# Patient Record
Sex: Female | Born: 1950
Health system: Southern US, Community
[De-identification: ages and names within clinical notes are randomized; demographics above are authoritative.]

## PROBLEM LIST (undated history)

## (undated) HISTORY — PX: BREAST EXCISIONAL BIOPSY: SUR124

---

## 2002-10-13 ENCOUNTER — Encounter: Admission: RE | Admit: 2002-10-13 | Discharge: 2002-10-13 | Payer: Self-pay | Admitting: Gastroenterology

## 2002-10-13 ENCOUNTER — Encounter: Payer: Self-pay | Admitting: Gastroenterology

## 2002-10-20 ENCOUNTER — Ambulatory Visit (HOSPITAL_COMMUNITY): Admission: RE | Admit: 2002-10-20 | Discharge: 2002-10-20 | Payer: Self-pay | Admitting: Gastroenterology

## 2012-03-26 DIAGNOSIS — E559 Vitamin D deficiency, unspecified: Secondary | ICD-10-CM | POA: Insufficient documentation

## 2012-11-04 ENCOUNTER — Other Ambulatory Visit: Payer: Self-pay

## 2012-11-04 DIAGNOSIS — Z1231 Encounter for screening mammogram for malignant neoplasm of breast: Secondary | ICD-10-CM

## 2012-11-17 ENCOUNTER — Ambulatory Visit: Payer: Self-pay

## 2012-11-18 ENCOUNTER — Ambulatory Visit
Admission: RE | Admit: 2012-11-18 | Discharge: 2012-11-18 | Disposition: A | Discharge status: Home / Self Care | Payer: 59 | Source: Ambulatory Visit

## 2012-11-18 DIAGNOSIS — Z1231 Encounter for screening mammogram for malignant neoplasm of breast: Secondary | ICD-10-CM

## 2012-11-23 ENCOUNTER — Other Ambulatory Visit: Payer: Self-pay | Admitting: Family Medicine

## 2012-11-23 DIAGNOSIS — N6320 Unspecified lump in the left breast, unspecified quadrant: Secondary | ICD-10-CM

## 2012-11-23 DIAGNOSIS — R928 Other abnormal and inconclusive findings on diagnostic imaging of breast: Secondary | ICD-10-CM

## 2012-12-13 ENCOUNTER — Ambulatory Visit
Admission: RE | Admit: 2012-12-13 | Discharge: 2012-12-13 | Disposition: A | Discharge status: Home / Self Care | Payer: 59 | Source: Ambulatory Visit | Attending: Family Medicine | Admitting: Family Medicine

## 2012-12-13 DIAGNOSIS — R928 Other abnormal and inconclusive findings on diagnostic imaging of breast: Secondary | ICD-10-CM

## 2013-03-06 DIAGNOSIS — K222 Esophageal obstruction: Secondary | ICD-10-CM | POA: Insufficient documentation

## 2013-11-28 ENCOUNTER — Other Ambulatory Visit: Payer: Self-pay

## 2013-11-28 DIAGNOSIS — Z1231 Encounter for screening mammogram for malignant neoplasm of breast: Secondary | ICD-10-CM

## 2013-11-29 ENCOUNTER — Ambulatory Visit
Admission: RE | Admit: 2013-11-29 | Discharge: 2013-11-29 | Disposition: A | Discharge status: Home / Self Care | Payer: 59 | Source: Ambulatory Visit

## 2013-11-29 DIAGNOSIS — Z1231 Encounter for screening mammogram for malignant neoplasm of breast: Secondary | ICD-10-CM

## 2016-07-04 ENCOUNTER — Ambulatory Visit (INDEPENDENT_AMBULATORY_CARE_PROVIDER_SITE_OTHER): Payer: Medicare HMO | Admitting: Family Medicine

## 2016-07-04 VITALS — BP 150/80 | HR 53 | Temp 98.0°F | Resp 20 | Ht <= 58 in | Wt 107.1 lb

## 2016-07-04 DIAGNOSIS — I10 Essential (primary) hypertension: Secondary | ICD-10-CM

## 2016-07-04 DIAGNOSIS — M159 Polyosteoarthritis, unspecified: Secondary | ICD-10-CM

## 2016-07-04 DIAGNOSIS — E559 Vitamin D deficiency, unspecified: Secondary | ICD-10-CM | POA: Diagnosis not present

## 2016-07-04 DIAGNOSIS — N951 Menopausal and female climacteric states: Secondary | ICD-10-CM | POA: Diagnosis not present

## 2016-07-04 DIAGNOSIS — J45909 Unspecified asthma, uncomplicated: Secondary | ICD-10-CM | POA: Insufficient documentation

## 2016-07-04 DIAGNOSIS — M150 Primary generalized (osteo)arthritis: Secondary | ICD-10-CM

## 2016-07-04 DIAGNOSIS — Z131 Encounter for screening for diabetes mellitus: Secondary | ICD-10-CM

## 2016-07-04 DIAGNOSIS — E789 Disorder of lipoprotein metabolism, unspecified: Secondary | ICD-10-CM

## 2016-07-04 DIAGNOSIS — R634 Abnormal weight loss: Secondary | ICD-10-CM | POA: Diagnosis not present

## 2016-07-04 LAB — POCT URINALYSIS DIP (MANUAL ENTRY)
BILIRUBIN UA: NEGATIVE
Blood, UA: NEGATIVE
Glucose, UA: NEGATIVE mg/dL
Ketones, POC UA: NEGATIVE mg/dL
LEUKOCYTES UA: NEGATIVE
Nitrite, UA: NEGATIVE
Protein Ur, POC: NEGATIVE mg/dL
Spec Grav, UA: 1.015 (ref 1.010–1.025)
Urobilinogen, UA: 0.2 E.U./dL
pH, UA: 6 (ref 5.0–8.0)

## 2016-07-04 NOTE — Patient Instructions (Addendum)
We recommend that you schedule a mammogram for breast cancer screening. Typically, you do not need a referral to do this. Please contact a local imaging center to schedule your mammogram.  [facility] - [phone]  *ask for the Radiology Department [facility]) - [phone] or [phone]  [facility] - [phone] [facility] - [phone] [facility] - [phone]  *ask for the Radiology Department [facility] - [phone]  *ask for the Radiology Department [facility] - [phone]  *ask for the Mammography Department [facility] - [phone]   IF you received an x-ray today, you will receive an invoice from [facility] Radiology. Please contact [facility] Radiology at [phone] with questions or concerns regarding your invoice.   IF you received labwork today, you will receive an invoice from [facility]. Please contact [facility] at [phone] with questions or concerns regarding your invoice.   Our billing staff will not be able to assist you with questions regarding bills from these companies.  You will be contacted with the lab results as soon as they are available. The fastest way to get your results is to activate your My Chart account. Instructions are located on the last page of this paperwork. If you have not heard from [place] regarding the results in 2 weeks, please contact this office.      Qu?n l t?ng huy?t p Managing Your Hypertension T?ng huy?t p th??ng ???c g?i l huy?t p cao. T?ng huy?t p l khi l?c c?a mu p ln thnh ??ng m?ch qu l?n. Cc ??ng m?ch l cc m?ch mu mang mu t? tim ?i kh?p c? th? c?a qu v?. T?ng huy?t p khi?n tim lm vi?c v?t v? h?n ?? b?m mu, v c th? khi?n cc ??ng m?ch tr? ln h?p ho?c c?ng. T?ng huy?t p khng ???c ?i?u tr? ho?c ki?m sot c th? d?n t?i nh?i mu c? tim, ??t qu?, b?nh th?n v nh?ng v?n ?? khc. C nh?ng  ch? s? huy?t p no? Ch? s? ?o huy?t p g?m m?t ch? s? cao trn m?t ch? s? th?p. Huy?t a?p ly? t???ng cu?a quy? vi? la? d??i 120/80. Ch? s? ??u tin ("??nh") ???c g?i l huy?t p tm thu. ?y l s? ?o p su?t trong ??ng m?ch khi tim qu v? ??p. Ch? s? th? hai ("?y") ???c g?i l huy?t p tm tr??ng. ?y l s? ?o p su?t trong ??ng m?ch khi tim qu v? ngh?Nicki Reaper? s? huy?t p c?a ti c ngh?a l g? Huy?t p ???c phn lo?i thnh b?n giai ?o?n. D?a trn ch? s? huy?t p c?a qu v?, chuyn gia ch?m  s?c kh?e c?a qu v? c th? s? d?ng nh?ng giai ?o?n sau ?y ?? xc ??nh ki?u ?i?u tr? qu v? c?n, n?u c. Huy?t p tm thu v huy?t p tm tr??ng ???c ?o theo ??n v? mm Hg. Bnh th??ng   Huy?t p tm thu: d??i 120.  Huy?t p tm tr??ng: d??i 80. Cao   Huy?t p tm thu: 120-129.  Huy?t p tm tr??ng: d??i 80. T?ng huy?t p giai ?o?n 1     Huy?t p tm tr??ng: 80-89. T?ng huy?t p giai ?o?n 2   Huy?t p tm thu: 140 tr? ln.  Huy?t p tm tr??ng: 90 tr? ln. Nh?ng nguy c? s?c kh?e no g?n v?i t?ng huy?t p? Qu?n l t?ng huy?t p c?a qu v? l m?t trch nhi?m quan tr?ng. T?ng  huy?t p khng ???c ki?m sot c th? d?n t?i:  Nh?i mu c? tim.  B? ??t qu?Marland Kitchen  M?ch mu b? y?u (ch?ng phnh m?ch).  Suy tim.  T?n th??ng th?n.  T?n th??ng m?t.  H?i ch?ng chuy?n ha.  Cc v?n ?? v? t?p trung v tr nh?. Ti c th? th?c hi?n nh?ng thay ??i no ?? qu?n l t?ng huy?t p c?a mnh? C th? qu?n l t?ng huy?t p b?ng cch thay ??i l?i s?ng v c th? l dng thu?c. Chuyn gia ch?m [place] s?c kh?e c?a qu v? s? gip qu v? ln k? ho?ch ??a huy?t p v? gi?i h?n bnh th??ng. ?n v u?ng   ?n ch? ?? giu ch?t x? v kali v t mu?i (natri), ???ng ph? gia v ch?t bo. M?t k? ho?ch ?n m?u c tn ch? ?? ?n DASH (Cch ti?p c?n ?n u?ng ?? gi?m t?ng huy?t p). ?n theo cch ny:  ?n nhi?u tri cy v rau t??i. Vo m?i b?a ?n, c? g?ng dnh m?t n?a ??a cho tri cy v rau.  ?n ng? c?c nguyn h?t, ch?ng h?n nh? m ?ng  lm t? b?t m nguyn cm, ho?c bnh m nguyn h?t. Cho ngu? c?c nguyn ca?m va?o m?t ph?n t? ??a c?a quy? vi?.  ?n cc s?n ph?m t? s?a t bo.  Trnh nh?ng mi?ng th?t nhi?u m?, th?t ? qua ch? bi?n ho?c th?t ??p mu?i v th?t gia c?m c da. Dnh kho?ng m?t ph?n t? ??a c?a qu v? cho cc protein khng m?, ch?ng h?n nh? c, th?t g khng da, ??u, tr?ng, v ??u ph?.  Trnh nh?ng th?c ph?m ch? bi?n ho?c lm s?n. Nh?ng th?c ph?m ny th??ng c nhi?u natri, ???ng ph? gia v ch?t bo h?n.    Gi?i h?n l??ng r??u qu v? u?ng khng qu 1 ly m?i ngy v?i ph? n? khng mang thai v 2 ly m?i ngy v?i nam gi?i. M?t ly t??ng ???ng v?i 12 ao-x? bia, 5 ao-x? r??u vang, ho?c 1 ao-x? r??u m?nh. L?i s?ng   H?p tc v?i chuyn gia ch?m [place] s?c kh?e c?a qu v? ?? duy tr tr?ng l??ng c? th? c l?i cho s?c kh?e, ho?c gi?m cn. Hy h?i xem tr?ng l??ng no l l t??ng cho qu v?.  Dnh t nh?t 30 pht ?? t?p th? d?c m c th? khi?n tim qu v? ??p nhanh h?n (t?p th? d?c nh?p ?i?u) h?u h?t cc ngy trong tu?n. Cc ho?t ??ng c th? bao g?m ?i b?, b?i, ho?c ??p xe.      Ki?m sot b?t k? tnh tr?ng ko di (m?n tnh) no m qu v? c, ch?ng h?n nh? cholesterol cao ho?c ti?u ???ng. Theo di   Theo di huy?t p c?a qu v? t?i nh theo h??ng d?n c?a chuyn gia ch?m [place] s?c kh?e. Huy?t p m?c tiu c nhn c?a qu v? c th? khc nhau ty thu?c v tnh tr?ng b?nh l, tu?i v cc nhn t? khc.  Ki?m tra huy?t p ??nh k?, th??ng xuyn theo h??ng d?n c?a chuyn gia ch?m [place] s?c kh?e. Trao ??i v?i chuyn gia ch?m [place] s?c kh?e c?a mnh   Cng v?i chuyn gia ch?m [place] s?c kh?e xem xt l?i m?i lo?i thu?c qu v? dng b?i v c th? c tc d?ng ph? ho?c t??ng tc thu?c.  Trao ??i v?i chuyn gia ch?m  s?c kh?e c?a qu v? v? ch? ?? ?n, thi qun luy?n t?p v cc y?u t?  l?i s?ng khc m c th? gp ph?n vo t?ng huy?t p.  Khm chuyn gia ch?m [place] s?c kh?e ??nh k?. Chuyn gia ch?m [place] s?c kh?e c?a qu v? c th? gip qu v? t?o ra v  ?i?u ch?nh k? ho?ch qu?n l t?ng huy?t p. Ti c c?n dng thu?c ?? ki?m sot huy?t p c?a mnh khng? N?u thay ??i l?i s?ng khng ?? ?? ??a huy?t p v? m?c c th? ki?m sot ???c, chuyn gia ch?m [place] s?c kh?e c th? k ??n thu?c, v n?u:  Huy?t p tm thu c?a qu v? t? 130 tr? ln.  Huy?t p tm tr??ng c?a qu v? t? 80 tr? ln. Ch? s? d?ng thu?c theo ch? d?n c?a chuyn gia ch?m [place] s?c kh?e cu?a quy? vi?. Lm theo ch? d?n m?t cch c?n th?n. Thu?c ?i?u tr? huy?t p ph?i ???c dng theo ??n ? k. Thu?c c?ng s? khng c tc d?ng khi qu v? b? li?u. Vi?c b? li?u thu?c c?ng lm qu v? c nguy c? pht sinh v?n ??. Hy lin l?c v?i chuyn gia ch?m [place] s?c kh?e n?u:  Qu v? ngh? qu v? c ph?n ?ng v?i thu?c ? dng.  Qu v? b? ?au ??u l?p ?i l?p l?i (tr? l?i).  Qu v? c?m th?y chng m?t.  Qu v? b? s?ng ph ? m?t c chn.  Qu v? c v?n ?? v? th? l?c. Yu c?u tr? gip ngay l?p t?c n?u:  Qu v? b? ?au ??u n?ng ho?c l l?n.  Qu v? b? y?u b?t th??ng ho?c t b, ho?c c?m th?y b? ng?t.  Qu v? b? ?au r?t nhi?u ? ng?c ho?c b?ng.  Qu v? nn nhi?u l?n.  Qu v? b? kh th?. Tm t?t  T?ng huy?t p l khi l?c b?m mu qua ??ng m?ch c?a qu v? qu m?nh. N?u tnh tr?ng ny khng ???c ki?m sot, n c th? khi?n qu v? g?p ph?i nguy c? bi?n ch?ng nghim tr?ng.  Huy?t p m?c tiu c nhn c?a qu v? c th? khc nhau ty thu?c v tnh tr?ng b?nh l, tu?i v cc nhn t? khc. ??i v?i h?u h?t m?i ng??i, huy?t p bnh th??ng l d??i 120/80.  Qu?n l t?ng huy?t p b?ng cch thay ??i l?i s?ng, dng thu?c, ho?c k?t h?p c? hai. Thay ??i l?i s?ng bao g?m gi?m cn, ?n ch? ?? ?n c l?i cho s?c kh?e, t mu?i, t?p th? d?c nhi?u h?n v h?n ch? u?ng r??u. Thng tin ny khng nh?m m?c ?ch thay th? cho l?i khuyn m chuyn gia ch?m [place] s?c kh?e ni v?i qu v?. Hy b?o ??m qu v? ph?i th?o lu?n b?t k? v?n ?? g m qu v? c v?i chuyn gia ch?m [place] s?c kh?e c?a qu v?. Document Released: 02/19/2016 Document Revised:  02/19/2016 Document Reviewed: 02/19/2016 Elsevier Interactive Patient Education  2017 [employer].

## 2016-07-04 NOTE — Progress Notes (Signed)
Chief Complaint  Patient presents with  . Anemia    HPI   ID [id number]  Essential Hypertension Pt reports that she has not been diagnosed with hypertension.  She checks her bp daily and gets sbp 120-125 at home. She eats a healthy well balanced diet.  Arthritis She works in an Theatre stage manager She reports pain in her thumbs and fingers and wanted to know what she can take for it  Screening She would like to be screened for diabetes and high cholesterol   No past medical history on file.  No current outpatient prescriptions on file.   No current facility-administered medications for this visit.     Allergies: No Known Allergies  No past surgical history on file.  Social History   Social History  . Marital status: Married    Spouse name: N/A  . Number of children: N/A  . Years of education: N/A   Social History Main Topics  . Smoking status: Never Smoker  . Smokeless tobacco: Never Used  . Alcohol use No  . Drug use: No  . Sexual activity: Not on file   Other Topics Concern  . Not on file   Social History Narrative  . No narrative on file    Review of Systems  Constitutional: Negative for chills, fever, malaise/fatigue and weight loss.  Eyes: Negative for blurred vision, double vision and photophobia.  Respiratory: Negative for cough, shortness of breath and wheezing.   Cardiovascular: Negative for chest pain and palpitations.  Gastrointestinal: Negative for abdominal pain, diarrhea, nausea and vomiting.  Genitourinary: Negative for dysuria, frequency and urgency.  Skin: Negative for itching and rash.  Neurological: Negative for dizziness, tingling and headaches.  Psychiatric/Behavioral: Negative for depression and hallucinations. The patient is not nervous/anxious.     Objective: Vitals:   07/04/16 1519 07/04/16 1558  BP: (!) 165/81 (!) 150/80  Pulse: (!) 53   Resp: 20   Temp: 98 F (36.7 C)   TempSrc: Oral   SpO2: 97%   Weight: 107 lb 2 oz (48.6  kg)   Height:  (1.448 m)    BP 152/97 01/09/15 Weight 107 # 01/09/15  Physical Exam  Constitutional: She is oriented to person, place, and time. She appears well-developed and well-nourished.  HENT:  Head: Normocephalic and atraumatic.  Right Ear: External ear normal.  Left Ear: External ear normal.  Mouth/Throat: Oropharynx is clear and moist.  Eyes: Conjunctivae and EOM are normal.  Cardiovascular: Normal rate, regular rhythm and normal heart sounds.   Pulmonary/Chest: Effort normal and breath sounds normal. No respiratory distress. She has no wheezes.  Neurological: She is alert and oriented to person, place, and time. She displays normal reflexes.  Skin: Skin is warm. Capillary refill takes less than 2 seconds. No erythema.    Hand exam Swans neck changes of 2nd and 3rd digits bilaterally   Review of Care Everywhere  01/09/2015 LDL 121 TC 217 HDL 76 HGB 04.5 HCT 38.4    Assessment and Plan Deborah Glass was seen today for anemia.  Diagnoses and all orders for this visit:  Borderline high cholesterol- reviewed care everywhere and levels were borderline but good -     Comprehensive metabolic panel -     Lipid panel  Screening for diabetes mellitus- will screen with fasting glucose a1c 01/09/15 was 5.4  Menopausal state- review of careeverywhere shows history of anemia  Will check today Will also check vit d since this is necessary for bone health -  CBC with Differential/Platelet -     VITAMIN D 25 Hydroxy (Vit-D Deficiency, Fractures) -     POCT urinalysis dipstick  Essential hypertension- diet controlled and aggravated by white coat hypertension Continue home bp monitoring Dash diet -     Microalbumin, urine  Vitamin D deficiency- will check   -     VITAMIN D 25 Hydroxy (Vit-D Deficiency, Fractures)  Primary osteoarthritis involving multiple joints- alleve arthritis for pain prn  Other orders -     Cancel: CBC with Differential/Platelet     Jaqualyn Juday  A Talulah Schirmer

## 2016-07-05 LAB — MICROALBUMIN, URINE

## 2016-07-06 LAB — CBC WITH DIFFERENTIAL/PLATELET
Basophils Absolute: 0 10*3/uL (ref 0.0–0.2)
Basos: 0 %
EOS (ABSOLUTE): 0.4 10*3/uL (ref 0.0–0.4)
EOS: 6 %
HEMOGLOBIN: 12.7 g/dL (ref 11.1–15.9)
Hematocrit: 38.1 % (ref 34.0–46.6)
Immature Grans (Abs): 0 10*3/uL (ref 0.0–0.1)
Immature Granulocytes: 0 %
LYMPHS ABS: 2.2 10*3/uL (ref 0.7–3.1)
Lymphs: 35 %
MCH: 30.1 pg (ref 26.6–33.0)
MCHC: 33.3 g/dL (ref 31.5–35.7)
MCV: 90 fL (ref 79–97)
MONOS ABS: 0.3 10*3/uL (ref 0.1–0.9)
Monocytes: 5 %
Neutrophils Absolute: 3.5 10*3/uL (ref 1.4–7.0)
Neutrophils: 54 %
Platelets: 255 10*3/uL (ref 150–379)
RBC: 4.22 x10E6/uL (ref 3.77–5.28)
RDW: 13.6 % (ref 12.3–15.4)
WBC: 6.5 10*3/uL (ref 3.4–10.8)

## 2016-07-06 LAB — COMPREHENSIVE METABOLIC PANEL
A/G RATIO: 1.6 (ref 1.2–2.2)
ALK PHOS: 73 IU/L (ref 39–117)
ALT: 18 IU/L (ref 0–32)
AST: 26 IU/L (ref 0–40)
Albumin: 4.3 g/dL (ref 3.6–4.8)
BUN/Creatinine Ratio: 24 (ref 12–28)
BUN: 14 mg/dL (ref 8–27)
Bilirubin Total: 0.3 mg/dL (ref 0.0–1.2)
CO2: 26 mmol/L (ref 18–29)
Calcium: 9.1 mg/dL (ref 8.7–10.3)
Chloride: 103 mmol/L (ref 96–106)
Creatinine, Ser: 0.59 mg/dL (ref 0.57–1.00)
GFR calc Af Amer: 110 mL/min/{1.73_m2} (ref >59)
GFR calc non Af Amer: 95 mL/min/{1.73_m2} (ref >59)
Globulin, Total: 2.7 g/dL (ref 1.5–4.5)
Glucose: 86 mg/dL (ref 65–99)
POTASSIUM: 4 mmol/L (ref 3.5–5.2)
Sodium: 141 mmol/L (ref 134–144)
Total Protein: 7 g/dL (ref 6.0–8.5)

## 2016-07-06 LAB — LIPID PANEL
Chol/HDL Ratio: 2.4 ratio (ref 0.0–4.4)
Cholesterol, Total: 209 mg/dL — ABNORMAL HIGH (ref 100–199)
HDL: 87 mg/dL (ref >39)
LDL Calculated: 108 mg/dL — ABNORMAL HIGH (ref 0–99)
TRIGLYCERIDES: 71 mg/dL (ref 0–149)
VLDL CHOLESTEROL CAL: 14 mg/dL (ref 5–40)

## 2016-07-06 LAB — VITAMIN D 25 HYDROXY (VIT D DEFICIENCY, FRACTURES): Vit D, 25-Hydroxy: 23.6 ng/mL — ABNORMAL LOW (ref 30.0–100.0)

## 2016-08-19 ENCOUNTER — Encounter: Payer: Self-pay | Admitting: Family Medicine

## 2017-01-23 DIAGNOSIS — K0889 Other specified disorders of teeth and supporting structures: Secondary | ICD-10-CM | POA: Diagnosis not present

## 2017-01-23 DIAGNOSIS — G4700 Insomnia, unspecified: Secondary | ICD-10-CM | POA: Diagnosis not present

## 2017-01-23 DIAGNOSIS — Z972 Presence of dental prosthetic device (complete) (partial): Secondary | ICD-10-CM | POA: Diagnosis not present

## 2017-01-23 DIAGNOSIS — Z6822 Body mass index (BMI) 22.0-22.9, adult: Secondary | ICD-10-CM | POA: Diagnosis not present

## 2017-01-23 DIAGNOSIS — J302 Other seasonal allergic rhinitis: Secondary | ICD-10-CM | POA: Diagnosis not present

## 2017-01-23 DIAGNOSIS — K08409 Partial loss of teeth, unspecified cause, unspecified class: Secondary | ICD-10-CM | POA: Diagnosis not present

## 2017-01-23 DIAGNOSIS — Z0000 Encounter for general adult medical examination without abnormal findings: Secondary | ICD-10-CM | POA: Diagnosis not present

## 2017-06-14 ENCOUNTER — Encounter: Payer: Self-pay | Admitting: Family Medicine

## 2017-06-14 ENCOUNTER — Ambulatory Visit (INDEPENDENT_AMBULATORY_CARE_PROVIDER_SITE_OTHER): Payer: Medicare HMO | Admitting: Family Medicine

## 2017-06-14 ENCOUNTER — Other Ambulatory Visit: Payer: Self-pay

## 2017-06-14 VITALS — BP 130/60 | HR 69 | Temp 98.3°F | Ht <= 58 in | Wt 104.0 lb

## 2017-06-14 DIAGNOSIS — R040 Epistaxis: Secondary | ICD-10-CM | POA: Diagnosis not present

## 2017-06-14 NOTE — Patient Instructions (Addendum)
IF you received an x-ray today, you will receive an invoice from [facility] Radiology. Please contact [facility] Radiology at [phone] with questions or concerns regarding your invoice.   IF you received labwork today, you will receive an invoice from [facility]. Please contact [facility] at [phone] with questions or concerns regarding your invoice.   Our billing staff will not be able to assist you with questions regarding bills from these companies.  You will be contacted with the lab results as soon as they are available. The fastest way to get your results is to activate your My Chart account. Instructions are located on the last page of this paperwork. If you have not heard from [place] regarding the results in 2 weeks, please contact this office.     Ch?y mu cam, Ng??i l?n (Nosebleed, Adult) Ch?y mu cam l khi mu ch?y ra t? m?i. Ch?y mu cam l tnh tr?ng ph? bi?n. Th??ng th ch?y mu cam khng ph?i l d?u hi?u c?a m?t tnh tr?ng nghim tr?ng. Ch?y mu cam c th? x?y ra n?u m?ch mu nh? trong m?i b?t ??u ch?y mu ho?c n?u nim m?c m?i (mng nh?y) n?t ra. Cc nguyn nhn ph? bi?n c?a ch?y mu cam:  D? ?ng.  C?m l?nh.  Ngoy m?i.  X m?i qu m?nh.  Ch?n th??ng do v?t g ? ?m vo trong m?i hay b? ?nh vo m?i.  Khng kh kh ho?c l?nh. Cc nguyn nhn t ph? bi?n h?n c?a ch?y mu cam bao g?m:  Khi ??c.  C ci g ? b?t th??ng trong m?i ho?c trong cc khoang kh gi?a cc x??ng m?t (xoang).  Kh?i u trong m?i, ch?ng h?n nh? polip.  Cc thu?c hay tnh tr?ng khi?n mu ?ng ch?m.  M?t s? b?nh ho?c th? thu?t gy kch ?ng hay lm kh ???ng m?i. H??NG D?N CH?M [place] T?I NH Khi qu v? b? ch?y mu cam:  Ng?i xu?ng v h?i ng? ??u v? pha tr??c.  Dng kh?n ho?c gi?y l?a s?ch k?p ch?t hai l? m?i bn d??i ph?n c x??ng c?a m?i. Sau 10 pht, nh? tay ra kh?i m?i v xem mu c b?t ??u ch?y l?i khng. Khng nh? tay p m?i ra tr??c th?i gian ?Marland Kitchen N?u v?n cn ch?y mu, l?p l?i  qu trnh k?p hai l? m?i nh? trn v gi? trong vng 10 pht cho ??n khi mu ng?ng ch?y.  Khng nht gi?y l?a hay g?c vo trong m?i ?? ng?n ch?y mu.  Trnh n?m xu?ng v trnh ng? ??u v? pha sau. ? t? th? ?, mu c th? ch?y xu?ng h?ng v gy ?e ho?c ho.  S? d?ng thu?c lm thng m?i d?ng x?t ?? h? tr? tnh tr?ng ch?y mu cam theo s? ch? d?n c?a chuyn gia ch?m [place] s?c kh?e.  Khng bi m? khong hay d?u khong vo trong m?i. Ch?t ny c th? nh? gi?t xu?ng ph?i. Sau khi h?t ch?y mu cam:  Trnh x m?i ho?c kh?t m?i m?nh trong vng vi gi?Marland Kitchen  Trnh ko/??y m?nh, nh?c, hay ci g?p ? vng th?t l?ng trong vi ngy. Qu v? c th? tr? l?i cc ho?t ??ng bnh th??ng khc khi c th?.  S? d?ng n??c mu?i d?ng x?t ho?c my ?i?u ?m theo s? ch? d?n c?a chuyn gia ch?m [place] s?c kh?e.  Thu?c aspirin v thu?c ch?ng ?ng mu lm ch?y mu d? x?y ra h?n. N?u qu v? ???c k nh?ng lo?i thu?c ny v qu v? b? ch?y mu  cam: ? Hy h?i chuyn gia ch?m [place] s?c kh?e xem qu v? c ph?i ng?ng dng nh?ng thu?c ny ho?c c ph?i ?i?u ch?nh li?u dng khng. ? Khng ng?ng dng nh?ng lo?i thu?c m chuyn gia ch?m [place] s?c kh?e khuyn dng tr? khi chuyn gia ch?m [place] s?c kh?e ch? d?n nh? v?y.  N?u ch?y mu cam do nim m?c kh, hy s? d?ng n??c mu?i khng c?n k ??n d?ng x?t m?i ho?c d?ng gel. Vi?c ny gip lm ?m nim m?c v gip lnh l?i. N?u qu v? ph?i s? d?ng ch?t bi tr?n: ? Hy ch?n lo?i c th? tan trong n??c. ? Ch? s? d?ng v?i l??ng c?n thi?t v ch? s? d?ng m?i khi c?n thi?t. ? Khng n?m xu?ng cho ??n khi ? s? d?ng ???c vi gi?. ?I KHM N?U:  Qu v? b? s?t.  Qu v? b? ch?y mu cam th??ng xuyn ho?c th??ng xuyn h?n so v?i bnh th??ng.  Qu v? r?t d? dng b? b?m tm.  Qu v? b? ch?y mu cam do c v?t g ? ?m vo m?i.  Qu v? b? ch?y mu trong mi?ng.  Qu v? nn ho?c ho ra ch?t c m?u nu.  Qu v? b? ch?y mu cam sau khi b?t ??u dng m?t lo?i thu?c m?i. NGAY L?P T?C ?I KHM N?U:  Qu v? b? ch?y mu cam sau  khi b? ng ho?c ch?n th??ng ? ??u.  Tnh tr?ng ch?y mu cam c?a qu v? khng h?t sau 20 pht.  Qu v? c?m th?y chng m?t ho?c y?u.  Qu v? b? ch?y mu b?t th??ng t? nh?ng b? ph?n khc c?a c? th?.  Qu v? b? b?m tm b?t th??ng trn nh?ng b? ph?n khc c?a c? th?.  Qu v? v m? hi ??m ?a.  Qu v? nn ra mu. Thng tin ny khng nh?m m?c ?ch thay th? cho l?i khuyn m chuyn gia ch?m [place] s?c kh?e ni v?i qu v?. Hy b?o ??m qu v? ph?i th?o lu?n b?t k? v?n ?? g m qu v? c v?i chuyn gia ch?m [place] s?c kh?e c?a qu v?. Document Released: 12/17/2004 Document Revised: 03/30/2014 Document Reviewed: 09/24/2015 Elsevier Interactive Patient Education  2018 [employer]

## 2017-06-14 NOTE — Progress Notes (Signed)
   3/25/20196:25 PM  Deborah Glass [birth year], 67 y.o. female [id number]  Chief Complaint  Patient presents with  . Hemoptysis    HPI:   Patient is a 67 y.o. female who presents today for 3-4 days of spitting up scant blood in the mornings when she brushes her teeth. She denies any other times of spitting up blood. She denies any swollen painful gums or teeth, she denies any h/o abnormal bleeding. She does report recently having itchy nose so she has been scratching with a q tip. She has had some mild sore throat. She denies any swollen glands, night sweats, cough, weight loss. Last dental appt 6 months ago.  [facility] 06/14/2017 07/04/2016  Decreased Interest 0 0  Down, Depressed, Hopeless 0 0  PHQ - 2 Score 0 0    No Known Allergies  Prior to Admission medications   Not on File    History reviewed. No pertinent past medical history.  History reviewed. No pertinent surgical history.  Social History   Tobacco Use  . Smoking status: Never Smoker  . Smokeless tobacco: Never Used  Substance Use Topics  . Alcohol use: No    Family History  Problem Relation Age of Onset  . Stomach cancer Father     ROS Per hpi  OBJECTIVE:  Blood pressure 130/60, pulse 69, temperature 98.3 F (36.8 C), height 4' 9.09" (1.45 m), weight 104 lb (47.2 kg), SpO2 97 %.  Physical Exam  Constitutional: She is well-developed, well-nourished, and in no distress.  HENT:  Head: Normocephalic and atraumatic.  Right Ear: External ear normal.  Left Ear: External ear normal.  Nose: No mucosal edema or rhinorrhea. Epistaxis (clots present, no active bleeding) is observed.  Mouth/Throat: Mucous membranes are normal. No oral lesions. No dental abscesses. No oropharyngeal exudate or posterior oropharyngeal erythema.     ASSESSMENT and PLAN  1. Epistaxis Discussed use of humidifier, nasal saline washes, no nose picking. RTC precautions reviewed.   Return if symptoms worsen or  fail to improve.    Myles LippsIrma M Santiago, MD Primary Care at [facility] [address] [place], [place] [address] Ph.  [phone] Fax [phone]

## 2018-07-14 ENCOUNTER — Other Ambulatory Visit: Payer: Self-pay | Admitting: Family Medicine

## 2018-07-14 DIAGNOSIS — Z1231 Encounter for screening mammogram for malignant neoplasm of breast: Secondary | ICD-10-CM

## 2018-09-09 ENCOUNTER — Ambulatory Visit: Payer: Self-pay

## 2018-10-20 ENCOUNTER — Other Ambulatory Visit: Payer: Self-pay

## 2018-10-20 ENCOUNTER — Ambulatory Visit
Admission: RE | Admit: 2018-10-20 | Discharge: 2018-10-20 | Disposition: A | Discharge status: Home / Self Care | Payer: Medicare HMO | Source: Ambulatory Visit | Attending: Family Medicine | Admitting: Family Medicine

## 2018-10-20 DIAGNOSIS — Z1231 Encounter for screening mammogram for malignant neoplasm of breast: Secondary | ICD-10-CM

## 2019-01-24 LAB — HM COLONOSCOPY

## 2019-04-05 ENCOUNTER — Telehealth: Payer: Self-pay | Admitting: *Deleted

## 2019-04-05 NOTE — Telephone Encounter (Signed)
Schedule AWV.  

## 2019-10-23 ENCOUNTER — Encounter (HOSPITAL_COMMUNITY): Payer: Self-pay

## 2019-10-23 ENCOUNTER — Ambulatory Visit (HOSPITAL_COMMUNITY)
Admission: EM | Admit: 2019-10-23 | Discharge: 2019-10-23 | Disposition: A | Discharge status: Home / Self Care | Payer: Medicare HMO | Attending: Urgent Care | Admitting: Urgent Care

## 2019-10-23 ENCOUNTER — Other Ambulatory Visit: Payer: Self-pay

## 2019-10-23 DIAGNOSIS — R69 Illness, unspecified: Secondary | ICD-10-CM | POA: Diagnosis not present

## 2019-10-23 DIAGNOSIS — R0789 Other chest pain: Secondary | ICD-10-CM | POA: Diagnosis not present

## 2019-10-23 DIAGNOSIS — J4530 Mild persistent asthma, uncomplicated: Secondary | ICD-10-CM | POA: Insufficient documentation

## 2019-10-23 DIAGNOSIS — R0602 Shortness of breath: Secondary | ICD-10-CM | POA: Diagnosis not present

## 2019-10-23 DIAGNOSIS — Z20822 Contact with and (suspected) exposure to covid-19: Secondary | ICD-10-CM | POA: Insufficient documentation

## 2019-10-23 DIAGNOSIS — J069 Acute upper respiratory infection, unspecified: Secondary | ICD-10-CM | POA: Diagnosis not present

## 2019-10-23 MED ORDER — PREDNISONE 10 MG PO TABS: 30.0000 mg | ORAL_TABLET | Freq: Every day | ORAL | 0 refills | Status: DC

## 2019-10-23 MED ORDER — NEBULIZER/TUBING/MOUTHPIECE KIT: PACK | 1 refills | Status: DC

## 2019-10-23 MED ORDER — ALBUTEROL SULFATE (2.5 MG/3ML) 0.083% IN NEBU: 2.5000 mg | INHALATION_SOLUTION | Freq: Four times a day (QID) | RESPIRATORY_TRACT | 0 refills | Status: DC | PRN

## 2019-10-23 MED ORDER — BENZONATATE 100 MG PO CAPS: 100.0000 mg | ORAL_CAPSULE | Freq: Three times a day (TID) | ORAL | 0 refills | Status: DC | PRN

## 2019-10-23 MED ORDER — PROMETHAZINE-DM 6.25-15 MG/5ML PO SYRP: 5.0000 mL | ORAL_SOLUTION | Freq: Every evening | ORAL | 0 refills | Status: DC | PRN

## 2019-10-23 MED ORDER — ALBUTEROL SULFATE HFA 108 (90 BASE) MCG/ACT IN AERS: 1.0000 | INHALATION_SPRAY | Freq: Four times a day (QID) | RESPIRATORY_TRACT | 0 refills | Status: DC | PRN

## 2019-10-23 MED ORDER — NEBULIZER SYSTEM ALL-IN-ONE MISC: 0 refills | Status: DC

## 2019-10-23 NOTE — ED Provider Notes (Signed)
[facility]   MRN: [id number] DOB: 11-Sep-1950  Subjective:   Sherine VIKKI GAINS is a 69 y.o. female presenting for 4-day history of acute onset productive cough, congestion.  Patient has a history of asthma and states that she has felt shortness of breath, chest pain with her coughing.  She is out of her albuterol inhaler, would also like a prescription for her nebulized albuterol and nebulizer machine.  States that she has felt subjective fever.  Has had her Covid vaccination, no history of COVID-19.  Denies loss of sense of taste and smell.  No current facility-administered medications for this encounter. No current outpatient medications on file.   No Known Allergies  History reviewed. No pertinent past medical history.   Past Surgical History:  Procedure Laterality Date  . BREAST EXCISIONAL BIOPSY Right     Family History  Problem Relation Age of Onset  . Stomach cancer Father     Social History   Tobacco Use  . Smoking status: Never Smoker  . Smokeless tobacco: Never Used  Substance Use Topics  . Alcohol use: No  . Drug use: No    ROS   Objective:   Vitals: BP 134/82 (BP Location: Left Arm)   Pulse 68   Temp 99 F (37.2 C) (Oral)   Resp 17   SpO2 95%   Physical Exam Constitutional:      General: She is not in acute distress.    Appearance: Normal appearance. She is well-developed. She is not ill-appearing, toxic-appearing or diaphoretic.  HENT:     Head: Normocephalic and atraumatic.     Right Ear: Tympanic membrane, ear canal and external ear normal. No drainage or tenderness. No middle ear effusion. Tympanic membrane is not erythematous.     Left Ear: Tympanic membrane, ear canal and external ear normal. No drainage or tenderness.  No middle ear effusion. Tympanic membrane is not erythematous.     Nose: Nose normal. No congestion or rhinorrhea.     Mouth/Throat:     Mouth: Mucous membranes are moist. No oral lesions.     Pharynx: Oropharynx is  clear. No pharyngeal swelling, oropharyngeal exudate, posterior oropharyngeal erythema or uvula swelling.     Tonsils: No tonsillar exudate or tonsillar abscesses.  Eyes:     General: No scleral icterus.       Right eye: No discharge.        Left eye: No discharge.     Extraocular Movements: Extraocular movements intact.     Right eye: Normal extraocular motion.     Left eye: Normal extraocular motion.     Conjunctiva/sclera: Conjunctivae normal.     Pupils: Pupils are equal, round, and reactive to light.  Cardiovascular:     Rate and Rhythm: Normal rate and regular rhythm.     Pulses: Normal pulses.     Heart sounds: Normal heart sounds. No murmur heard.  No friction rub. No gallop.   Pulmonary:     Effort: Pulmonary effort is normal. No respiratory distress.     Breath sounds: Normal breath sounds. No stridor. No wheezing, rhonchi or rales.  Musculoskeletal:     Cervical back: Normal range of motion and neck supple.  Lymphadenopathy:     Cervical: No cervical adenopathy.  Skin:    General: Skin is warm and dry.     Findings: No rash.  Neurological:     General: No focal deficit present.     Mental Status: She is alert and oriented  to person, place, and time.  Psychiatric:        Mood and Affect: Mood normal.        Behavior: Behavior normal.        Thought Content: Thought content normal.        Judgment: Judgment normal.      Assessment and Plan :   PDMP not reviewed this encounter.  1. Viral URI with cough   2. Shortness of breath   3. Atypical chest pain   4. Mild persistent asthma without complication     In light of patient's asthma, will refill her albuterol inhaler, provided with prescription for nebulizer machine, nebulizer butyryl and will also use a 5-day steroid course at 30 mg once daily.  Will manage for viral illness such as viral URI, viral syndrome, viral rhinitis, COVID-19. Counseled patient on nature of COVID-19 including modes of transmission,  diagnostic testing, management and supportive care.  Offered scripts for symptomatic relief. COVID 19 testing is pending. Counseled patient on potential for adverse effects with medications prescribed/recommended today, ER and return-to-clinic precautions discussed, patient verbalized understanding.     Wallis Bamberg, [place] 10/23/19 1806

## 2019-10-23 NOTE — ED Triage Notes (Signed)
Pt presents with non productive cough and congestion X 4 days. 

## 2019-10-24 LAB — SARS CORONAVIRUS 2 (TAT 6-24 HRS): SARS Coronavirus 2: NEGATIVE

## 2019-11-01 ENCOUNTER — Ambulatory Visit (INDEPENDENT_AMBULATORY_CARE_PROVIDER_SITE_OTHER): Payer: Medicare HMO

## 2019-11-01 ENCOUNTER — Ambulatory Visit (HOSPITAL_COMMUNITY)
Admission: EM | Admit: 2019-11-01 | Discharge: 2019-11-01 | Disposition: A | Discharge status: Home / Self Care | Payer: Medicare HMO | Attending: Urgent Care | Admitting: Urgent Care

## 2019-11-01 ENCOUNTER — Encounter (HOSPITAL_COMMUNITY): Payer: Self-pay | Admitting: Emergency Medicine

## 2019-11-01 ENCOUNTER — Other Ambulatory Visit: Payer: Self-pay

## 2019-11-01 DIAGNOSIS — J4530 Mild persistent asthma, uncomplicated: Secondary | ICD-10-CM

## 2019-11-01 DIAGNOSIS — J019 Acute sinusitis, unspecified: Secondary | ICD-10-CM

## 2019-11-01 DIAGNOSIS — R0602 Shortness of breath: Secondary | ICD-10-CM

## 2019-11-01 DIAGNOSIS — R059 Cough, unspecified: Secondary | ICD-10-CM

## 2019-11-01 DIAGNOSIS — R05 Cough: Secondary | ICD-10-CM | POA: Diagnosis not present

## 2019-11-01 DIAGNOSIS — R509 Fever, unspecified: Secondary | ICD-10-CM | POA: Diagnosis not present

## 2019-11-01 MED ORDER — AMOXICILLIN 500 MG PO CAPS: 500.0000 mg | ORAL_CAPSULE | Freq: Three times a day (TID) | ORAL | 0 refills | Status: DC

## 2019-11-01 MED ORDER — CETIRIZINE HCL 10 MG PO TABS: 10.0000 mg | ORAL_TABLET | Freq: Every day | ORAL | 0 refills | Status: DC

## 2019-11-01 NOTE — ED Provider Notes (Addendum)
[place]   MRN: [id number] DOB: 1950-07-30  Subjective:   Deborah Glass is a 69 y.o. female presenting for 10-day history of persistent cough, shortness of breath, malaise, frontal headache that wraps around to the left side of her head.  Patient has a history of asthma, was seen in 10/23/2019, prescribed prednisone course and given prescription for nebulizer machine with albuterol.  She has used these consistently.  Had a negative COVID-19 test.  Denies confusion, vision change, weakness, runny or stuffy nose, ear pain, throat pain.  No current facility-administered medications for this encounter.  Current Outpatient Medications:  .  albuterol (PROVENTIL) (2.5 MG/3ML) 0.083% nebulizer solution, Take 3 mLs (2.5 mg total) by nebulization every 6 (six) hours as needed for wheezing or shortness of breath., Disp: 75 mL, Rfl: 0 .  albuterol (VENTOLIN HFA) 108 (90 Base) MCG/ACT inhaler, Inhale 1-2 puffs into the lungs every 6 (six) hours as needed for wheezing or shortness of breath., Disp: 18 g, Rfl: 0 .  benzonatate (TESSALON) 100 MG capsule, Take 1-2 capsules (100-200 mg total) by mouth 3 (three) times daily as needed., Disp: 60 capsule, Rfl: 0 .  Nebulizer System All-In-One MISC, Use once every 4-6 hours as needed with albuterol., Disp: 1 each, Rfl: 0 .  predniSONE (DELTASONE) 10 MG tablet, Take 3 tablets (30 mg total) by mouth daily with breakfast., Disp: 15 tablet, Rfl: 0 .  promethazine-dextromethorphan (PROMETHAZINE-DM) 6.25-15 MG/5ML syrup, Take 5 mLs by mouth at bedtime as needed for cough., Disp: 100 mL, Rfl: 0 .  Respiratory Therapy Supplies (NEBULIZER/TUBING/MOUTHPIECE) KIT, Use once every 4-6 hours as needed with albuterol., Disp: 1 kit, Rfl: 1   No Known Allergies  History reviewed. No pertinent past medical history.   Past Surgical History:  Procedure Laterality Date  . BREAST EXCISIONAL BIOPSY Right     Family History  Problem Relation Age of Onset  . Stomach  cancer Father     Social History   Tobacco Use  . Smoking status: Never Smoker  . Smokeless tobacco: Never Used  Substance Use Topics  . Alcohol use: No  . Drug use: No    ROS   Objective:   Vitals: BP (!) 160/87 (BP Location: Right Arm)   Pulse 88   Temp 99.6 F (37.6 C) (Oral)   Resp 18   SpO2 98%   Wt Readings from Last 3 Encounters:  06/14/17 104 lb (47.2 kg)  07/04/16 107 lb 2 oz (48.6 kg)   Temp Readings from Last 3 Encounters:  11/01/19 99.6 F (37.6 C) (Oral)  10/23/19 99 F (37.2 C) (Oral)  06/14/17 98.3 F (36.8 C)   BP Readings from Last 3 Encounters:  11/01/19 (!) 160/87  10/23/19 134/82  06/14/17 130/60   Pulse Readings from Last 3 Encounters:  11/01/19 88  10/23/19 68  06/14/17 69   BP recheck 160/80.   Physical Exam Constitutional:      General: She is not in acute distress.    Appearance: Normal appearance. She is well-developed. She is not ill-appearing, toxic-appearing or diaphoretic.  HENT:     Head: Normocephalic and atraumatic.     Right Ear: External ear normal.     Left Ear: External ear normal.     Nose: Nose normal.     Mouth/Throat:     Mouth: Mucous membranes are moist.     Pharynx: Oropharynx is clear. No oropharyngeal exudate or posterior oropharyngeal erythema.  Eyes:     General: No scleral  icterus.       Right eye: No discharge.        Left eye: No discharge.     Extraocular Movements: Extraocular movements intact.     Conjunctiva/sclera: Conjunctivae normal.     Pupils: Pupils are equal, round, and reactive to light.  Cardiovascular:     Rate and Rhythm: Normal rate and regular rhythm.     Pulses: Normal pulses.     Heart sounds: Normal heart sounds. No murmur heard.  No friction rub. No gallop.   Pulmonary:     Effort: Pulmonary effort is normal. No respiratory distress.     Breath sounds: Normal breath sounds. No stridor. No wheezing, rhonchi or rales.  Musculoskeletal:     Right lower leg: No edema.      Left lower leg: No edema.  Skin:    General: Skin is warm and dry.     Findings: No rash.  Neurological:     Mental Status: She is alert and oriented to person, place, and time.     Cranial Nerves: No cranial nerve deficit.     Motor: No weakness.     Coordination: Coordination normal.     Gait: Gait normal.     Deep Tendon Reflexes: Reflexes normal.     Comments: Negative Romberg and pronator drift.  Psychiatric:        Mood and Affect: Mood normal.        Behavior: Behavior normal.        Thought Content: Thought content normal.        Judgment: Judgment normal.    Negative x-ray, overread is pending.  Assessment and Plan :   PDMP not reviewed this encounter.  1. Acute non-recurrent sinusitis, unspecified location   2. Cough   3. Shortness of breath   4. Mild persistent asthma without complication     Start amoxicillin to cover for sinus infection.  Overread is pending, patient was primarily concerned with ruling out cancer.  I tried to reassure her on this.  Ultimately if the radiology report shows something concerning I will call the patient.  Maintain albuterol treatments as needed. Counseled patient on potential for adverse effects with medications prescribed/recommended today, ER and return-to-clinic precautions discussed, patient verbalized understanding.    Jaynee Eagles, [place] 11/01/19 1730   DG Chest 2 View  Result Date: 11/01/2019 CLINICAL DATA:  Cough, shortness of breath, wheezing, and fever. EXAM: CHEST - 2 VIEW COMPARISON:  None. FINDINGS: The heart size and mediastinal contours are within normal limits. Both lungs are clear. The visualized skeletal structures are unremarkable. IMPRESSION: No active cardiopulmonary disease. Electronically Signed   By: Titus Dubin M.D.   On: 11/01/2019 17:30     Jaynee Eagles, PA-C 11/01/19 1733

## 2019-11-01 NOTE — ED Triage Notes (Addendum)
Triage done using a vietnamese interpreter.  Pt presents to [facility] for assessment of continued cough, fever, body aches, and shortness of breath since her last visit.  COVID was negative at last visit

## 2020-01-10 ENCOUNTER — Other Ambulatory Visit: Payer: Self-pay

## 2020-01-10 ENCOUNTER — Ambulatory Visit (INDEPENDENT_AMBULATORY_CARE_PROVIDER_SITE_OTHER): Payer: Medicare HMO | Admitting: Family Medicine

## 2020-01-10 ENCOUNTER — Encounter: Payer: Self-pay | Admitting: Family Medicine

## 2020-01-10 VITALS — BP 117/71 | HR 71 | Temp 98.1°F | Ht <= 58 in | Wt 106.0 lb

## 2020-01-10 DIAGNOSIS — R1012 Left upper quadrant pain: Secondary | ICD-10-CM | POA: Diagnosis not present

## 2020-01-10 DIAGNOSIS — E559 Vitamin D deficiency, unspecified: Secondary | ICD-10-CM | POA: Diagnosis not present

## 2020-01-10 DIAGNOSIS — Z1329 Encounter for screening for other suspected endocrine disorder: Secondary | ICD-10-CM | POA: Diagnosis not present

## 2020-01-10 DIAGNOSIS — K0889 Other specified disorders of teeth and supporting structures: Secondary | ICD-10-CM

## 2020-01-10 DIAGNOSIS — Z13220 Encounter for screening for lipoid disorders: Secondary | ICD-10-CM

## 2020-01-10 DIAGNOSIS — Z780 Asymptomatic menopausal state: Secondary | ICD-10-CM | POA: Diagnosis not present

## 2020-01-10 DIAGNOSIS — Z23 Encounter for immunization: Secondary | ICD-10-CM | POA: Diagnosis not present

## 2020-01-10 DIAGNOSIS — Z1159 Encounter for screening for other viral diseases: Secondary | ICD-10-CM

## 2020-01-10 DIAGNOSIS — Z131 Encounter for screening for diabetes mellitus: Secondary | ICD-10-CM

## 2020-01-10 DIAGNOSIS — Z13820 Encounter for screening for osteoporosis: Secondary | ICD-10-CM

## 2020-01-10 DIAGNOSIS — E2839 Other primary ovarian failure: Secondary | ICD-10-CM

## 2020-01-10 DIAGNOSIS — Z972 Presence of dental prosthetic device (complete) (partial): Secondary | ICD-10-CM

## 2020-01-10 NOTE — Addendum Note (Signed)
Addended by: Alm Bustard R on: 01/10/2020 03:16 PM   Modules accepted: Orders

## 2020-01-10 NOTE — Progress Notes (Signed)
10/20/20213:11 PM  Deborah Glass 24-Aug-1950, 69 y.o., female [id number]  Chief Complaint  Patient presents with  . screening for elevated glucose  . screening for cholesterol  . Abdominal Pain    intermittent     HPI:   Patient is a 69 y.o. female with no past medical history significant for who presents today for abdominal pain  Abdominal pain Intermittent LUQ pain lasts for 30 min and goes away Been occurring over years No specific time of day occurs about twice a month Not associated with eating At its worst 2/10 Unable to describe Denies N/V, constipation, blood in stool, heartburn, poor taste in mouth Has tried no medications for this  Recent ER visits 11/01/2019: sinusitis 10/23/2019: Viral URI  Depression screen [facility] 2/9 01/10/2020 06/14/2017 07/04/2016  Decreased Interest 0 0 0  Down, Depressed, Hopeless 0 0 0  PHQ - 2 Score 0 0 0    Fall Risk  01/10/2020 06/14/2017 07/04/2016  Falls in the past year? 0 No No  Number falls in past yr: 0 - -  Injury with Fall? 0 - -  Follow up Falls evaluation completed - -     No Known Allergies  Prior to Admission medications   Medication Sig Start Date End Date Taking? Authorizing Provider  albuterol (PROVENTIL) (2.5 MG/3ML) 0.083% nebulizer solution Take 3 mLs (2.5 mg total) by nebulization every 6 (six) hours as needed for wheezing or shortness of breath. Patient not taking: Reported on 01/10/2020 10/23/19   Jaynee Eagles, PA-C  albuterol (VENTOLIN HFA) 108 (90 Base) MCG/ACT inhaler Inhale 1-2 puffs into the lungs every 6 (six) hours as needed for wheezing or shortness of breath. Patient not taking: Reported on 01/10/2020 10/23/19   Jaynee Eagles, PA-C  amoxicillin (AMOXIL) 500 MG capsule Take 1 capsule (500 mg total) by mouth 3 (three) times daily. Patient not taking: Reported on 01/10/2020 11/01/19   Jaynee Eagles, PA-C  benzonatate (TESSALON) 100 MG capsule Take 1-2 capsules (100-200 mg total) by mouth 3 (three) times daily as  needed. Patient not taking: Reported on 01/10/2020 10/23/19   Jaynee Eagles, PA-C  cetirizine (ZYRTEC ALLERGY) 10 MG tablet Take 1 tablet (10 mg total) by mouth daily. Patient not taking: Reported on 01/10/2020 11/01/19   Jaynee Eagles, PA-C  Nebulizer System All-In-One MISC Use once every 4-6 hours as needed with albuterol. Patient not taking: Reported on 01/10/2020 10/23/19   Jaynee Eagles, PA-C  promethazine-dextromethorphan (PROMETHAZINE-DM) 6.25-15 MG/5ML syrup Take 5 mLs by mouth at bedtime as needed for cough. Patient not taking: Reported on 01/10/2020 10/23/19   Jaynee Eagles, PA-C  Respiratory Therapy Supplies (NEBULIZER/TUBING/MOUTHPIECE) KIT Use once every 4-6 hours as needed with albuterol. Patient not taking: Reported on 01/10/2020 10/23/19   Jaynee Eagles, PA-C    No past medical history on file.  Past Surgical History:  Procedure Laterality Date  . BREAST EXCISIONAL BIOPSY Right     Social History   Tobacco Use  . Smoking status: Never Smoker  . Smokeless tobacco: Never Used  Substance Use Topics  . Alcohol use: No    Family History  Problem Relation Age of Onset  . Stomach cancer Father     Review of Systems  Constitutional: Negative for chills, fever, malaise/fatigue and weight loss.  Eyes: Negative for blurred vision and double vision.  Respiratory: Negative for cough, shortness of breath and wheezing.   Cardiovascular: Negative for chest pain, palpitations and leg swelling.  Gastrointestinal: Negative for blood in stool, constipation, diarrhea,  heartburn, nausea and vomiting.       Intermittent abdominal pain lasting 63mn twice per month   Genitourinary: Negative for dysuria, frequency and hematuria.  Musculoskeletal: Negative for back pain and joint pain.  Neurological: Negative for dizziness, weakness and headaches.  Endo/Heme/Allergies: Does not bruise/bleed easily.     OBJECTIVE:  Today's Vitals   01/10/20 1411  BP: 117/71  Pulse: 71  Temp: 98.1 F (36.7 C)   SpO2: 95%  Weight: 106 lb (48.1 kg)  Height: 4' 9"  (1.448 m)   Body mass index is 22.94 kg/m.  Screenings Mammogram: last 10/20/2018 Needs mammogram: 09/2018 last  dexa scan: Scheduled Colon cancer screen: Colonoscopy 01/2019: no abnormalities: 10 years follow up Needs pneumonia, tetanus and flu vaccine  Physical Exam Constitutional:      General: She is not in acute distress.    Appearance: Normal appearance. She is not ill-appearing.  HENT:     Head: Normocephalic.     Mouth/Throat:     Mouth: Mucous membranes are moist.     Pharynx: Oropharynx is clear. No oropharyngeal exudate or posterior oropharyngeal erythema.  Cardiovascular:     Rate and Rhythm: Normal rate and regular rhythm.     Pulses: Normal pulses.     Heart sounds: Normal heart sounds. No murmur heard.  No friction rub. No gallop.   Pulmonary:     Effort: Pulmonary effort is normal. No respiratory distress.     Breath sounds: Normal breath sounds. No stridor. No wheezing, rhonchi or rales.  Abdominal:     General: Abdomen is flat. Bowel sounds are normal. There is no distension.     Palpations: Abdomen is soft. There is no mass.     Tenderness: There is no abdominal tenderness. There is no guarding.     Hernia: No hernia is present.  Musculoskeletal:     Right lower leg: No edema.     Left lower leg: No edema.  Skin:    General: Skin is warm and dry.     Coloration: Skin is not jaundiced.     Findings: No bruising.  Neurological:     Mental Status: She is alert and oriented to person, place, and time.  Psychiatric:        Mood and Affect: Mood normal.        Behavior: Behavior normal.    No results found for this or any previous visit (from the past 24 hour(s)).  No results found.   ASSESSMENT and PLAN  Problem List Items Addressed This Visit      Other   Vitamin D deficiency - Primary   Relevant Orders   Vitamin D, 25-hydroxy    Other Visit Diagnoses    Screening for thyroid disorder        Relevant Orders   TSH   Diabetes mellitus screening       Relevant Orders   Basic Metabolic Panel   CBC   Hemoglobin A1c   Lipid screening       Relevant Orders   Lipid Panel   Left upper quadrant abdominal pain       Relevant Orders   Lipase   Amylase   Encounter for hepatitis C screening test for low risk patient       Relevant Orders   HCV Ab w/Rflx to Verification   Encounter for immunization       Poorly fitting dentures       Relevant Orders   Ambulatory referral to Dentistry  Encounter for osteoporosis screening in asymptomatic postmenopausal patient       Relevant Orders   DG Bone Density   Estrogen deficiency        Vaccines This visit: Influenza Tentanus PCV 23: 1st dose Covid done per patient  Will follow up with lab results  Return in about 6 months (around 07/10/2020).    Huston Foley Tj Kitchings, FNP-BC [facility] at [place], [place] [address] Ph.  [phone] Fax [phone]

## 2020-01-10 NOTE — Patient Instructions (Addendum)
I will place order for dexa scan, they will contact you   We recommend that you schedule a mammogram for breast cancer screening. Typically, you do not need a referral to do this. Please contact a local imaging center to schedule your mammogram.  [facility] - [phone]  *ask for the Radiology Department [facility]) - [phone] or [phone]  [facility] - [phone] [facility] - [phone] [facility] - [phone]  *ask for the Radiology Department [facility] - [phone]  *ask for the Radiology Department [facility] - [phone]  *ask for the Mammography Department [facility] - [phone]   If you have lab work done today you will be contacted with your lab results within the next 2 weeks.  If you have not heard from [place] then please contact us. The fastest way to get your results is to register for My Chart.   IF you received an x-ray today, you will receive an invoice from [facility]. Please contact [facility] at [phone] with questions or concerns regarding your invoice.   IF you received labwork today, you will receive an invoice from [facility]. Please contact [facility] at [phone] with questions or concerns regarding your invoice.   Our billing staff will not be able to assist you with questions regarding bills from these companies.  You will be contacted with the lab results as soon as they are available. The fastest way to get your results is to activate your My Chart account. Instructions are located on the last page of this paperwork. If you have not heard from [place] regarding the results in 2 weeks, please contact this office.     Health Maintenance After Age 33 After age 59, you are at a higher risk for certain [place]-term diseases and infections as well as injuries from falls. Falls are a major cause of broken  bones and head injuries in people who are older than age 42. Getting regular preventive care can help to keep you healthy and well. Preventive care includes getting regular testing and making lifestyle changes as recommended by your health care provider. Talk with your health care provider about:  Which screenings and tests you should have. A screening is a test that checks for a disease when you have no symptoms.  A diet and exercise plan that is right for you. What should I know about screenings and tests to prevent falls? Screening and testing are the best ways to find a health problem early. Early diagnosis and treatment give you the best chance of managing medical conditions that are common after age 70. Certain conditions and lifestyle choices may make you more likely to have a fall. Your health care provider may recommend:  Regular vision checks. Poor vision and conditions such as cataracts can make you more likely to have a fall. If you wear glasses, make sure to get your prescription updated if your vision changes.  Medicine review. Work with your health care provider to regularly review all of the medicines you are taking, including over-the-counter medicines. Ask your health care provider about any side effects that may make you more likely to have a fall. Tell your health care provider if any medicines that you take make you feel dizzy or sleepy.  Osteoporosis screening. Osteoporosis is a condition that causes the bones to get weaker. This can make the bones weak and cause them to break  more easily.  Blood pressure screening. Blood pressure changes and medicines to control blood pressure can make you feel dizzy.  Strength and balance checks. Your health care provider may recommend certain tests to check your strength and balance while standing, walking, or changing positions.  Foot health exam. Foot pain and numbness, as well as not wearing proper footwear, can make you more likely to  have a fall.  Depression screening. You may be more likely to have a fall if you have a fear of falling, feel emotionally low, or feel unable to do activities that you used to do.  Alcohol use screening. Using too much alcohol can affect your balance and may make you more likely to have a fall. What actions can I take to lower my risk of falls? General instructions  Talk with your health care provider about your risks for falling. Tell your health care provider if: ? You fall. Be sure to tell your health care provider about all falls, even ones that seem minor. ? You feel dizzy, sleepy, or off-balance.  Take over-the-counter and prescription medicines only as told by your health care provider. These include any supplements.  Eat a healthy diet and maintain a healthy weight. A healthy diet includes low-fat dairy products, low-fat (lean) meats, and fiber from whole grains, beans, and lots of fruits and vegetables. Home safety  Remove any tripping hazards, such as rugs, cords, and clutter.  Install safety equipment such as grab bars in bathrooms and safety rails on stairs.  Keep rooms and walkways well-lit. Activity   Follow a regular exercise program to stay fit. This will help you maintain your balance. Ask your health care provider what types of exercise are appropriate for you.  If you need a cane or walker, use it as recommended by your health care provider.  Wear supportive shoes that have nonskid soles. Lifestyle  Do not drink alcohol if your health care provider tells you not to drink.  If you drink alcohol, limit how much you have: ? 0-1 drink a day for women. ? 0-2 drinks a day for men.  Be aware of how much alcohol is in your drink. In the [place] one drink equals one typical bottle of beer (12 oz), one-half glass of wine (5 oz), or one shot of hard liquor (1 oz).  Do not use any products that contain nicotine or tobacco, such as cigarettes and e-cigarettes. If you need  help quitting, ask your health care provider. Summary  Having a healthy lifestyle and getting preventive care can help to protect your health and wellness after age 28.  Screening and testing are the best way to find a health problem early and help you avoid having a fall. Early diagnosis and treatment give you the best chance for managing medical conditions that are more common for people who are older than age 59.  Falls are a major cause of broken bones and head injuries in people who are older than age 70. Take precautions to prevent a fall at home.  Work with your health care provider to learn what changes you can make to improve your health and wellness and to prevent falls. This information is not intended to replace advice given to you by your health care provider. Make sure you discuss any questions you have with your health care provider. Document Revised: 06/30/2018 Document Reviewed: 01/20/2017 Elsevier Patient Education  2020 [employer].

## 2020-01-11 ENCOUNTER — Other Ambulatory Visit: Payer: Self-pay | Admitting: Family Medicine

## 2020-01-11 DIAGNOSIS — E559 Vitamin D deficiency, unspecified: Secondary | ICD-10-CM

## 2020-01-11 LAB — HEMOGLOBIN A1C
Est. average glucose Bld gHb Est-mCnc: 108 mg/dL
Hgb A1c MFr Bld: 5.4 % (ref 4.8–5.6)

## 2020-01-11 LAB — BASIC METABOLIC PANEL
BUN/Creatinine Ratio: 27 (ref 12–28)
BUN: 15 mg/dL (ref 8–27)
CO2: 23 mmol/L (ref 20–29)
Calcium: 9 mg/dL (ref 8.7–10.3)
Chloride: 106 mmol/L (ref 96–106)
Creatinine, Ser: 0.56 mg/dL — ABNORMAL LOW (ref 0.57–1.00)
GFR calc Af Amer: 108 mL/min/{1.73_m2} (ref >59)
GFR calc non Af Amer: 94 mL/min/{1.73_m2} (ref >59)
Glucose: 85 mg/dL (ref 65–99)
Potassium: 4.2 mmol/L (ref 3.5–5.2)
Sodium: 142 mmol/L (ref 134–144)

## 2020-01-11 LAB — CBC
Hematocrit: 36.4 % (ref 34.0–46.6)
Hemoglobin: 12.5 g/dL (ref 11.1–15.9)
MCH: 31.6 pg (ref 26.6–33.0)
MCHC: 34.3 g/dL (ref 31.5–35.7)
MCV: 92 fL (ref 79–97)
Platelets: 245 10*3/uL (ref 150–450)
RBC: 3.96 x10E6/uL (ref 3.77–5.28)
RDW: 12.9 % (ref 11.7–15.4)
WBC: 4.7 10*3/uL (ref 3.4–10.8)

## 2020-01-11 LAB — LIPID PANEL
Chol/HDL Ratio: 2.7 ratio (ref 0.0–4.4)
Cholesterol, Total: 187 mg/dL (ref 100–199)
HDL: 69 mg/dL (ref >39)
LDL Chol Calc (NIH): 100 mg/dL — ABNORMAL HIGH (ref 0–99)
Triglycerides: 99 mg/dL (ref 0–149)
VLDL Cholesterol Cal: 18 mg/dL (ref 5–40)

## 2020-01-11 LAB — VITAMIN D 25 HYDROXY (VIT D DEFICIENCY, FRACTURES): Vit D, 25-Hydroxy: 19.2 ng/mL — ABNORMAL LOW (ref 30.0–100.0)

## 2020-01-11 LAB — HCV INTERPRETATION

## 2020-01-11 LAB — HCV AB W/RFLX TO VERIFICATION: HCV Ab: 0.2 s/co ratio (ref 0.0–0.9)

## 2020-01-11 LAB — TSH: TSH: 2.5 u[IU]/mL (ref 0.450–4.500)

## 2020-01-11 LAB — AMYLASE: Amylase: 108 U/L (ref 31–110)

## 2020-01-11 LAB — LIPASE: Lipase: 57 U/L (ref 14–85)

## 2020-01-11 MED ORDER — VITAMIN D 25 MCG (1000 UNIT) PO TABS: 1000.0000 [IU] | ORAL_TABLET | Freq: Every day | ORAL | 1 refills | Status: AC

## 2020-01-15 DIAGNOSIS — Z008 Encounter for other general examination: Secondary | ICD-10-CM | POA: Diagnosis not present

## 2020-12-09 ENCOUNTER — Other Ambulatory Visit: Payer: Self-pay

## 2020-12-09 ENCOUNTER — Ambulatory Visit (INDEPENDENT_AMBULATORY_CARE_PROVIDER_SITE_OTHER): Payer: Medicare Other | Admitting: Internal Medicine

## 2020-12-09 ENCOUNTER — Encounter: Payer: Self-pay | Admitting: Internal Medicine

## 2020-12-09 VITALS — BP 126/74 | HR 74 | Temp 98.6°F | Resp 16 | Ht <= 58 in | Wt 108.0 lb

## 2020-12-09 DIAGNOSIS — E785 Hyperlipidemia, unspecified: Secondary | ICD-10-CM | POA: Diagnosis not present

## 2020-12-09 DIAGNOSIS — Z0000 Encounter for general adult medical examination without abnormal findings: Secondary | ICD-10-CM

## 2020-12-09 DIAGNOSIS — F5104 Psychophysiologic insomnia: Secondary | ICD-10-CM | POA: Diagnosis not present

## 2020-12-09 DIAGNOSIS — E559 Vitamin D deficiency, unspecified: Secondary | ICD-10-CM | POA: Diagnosis not present

## 2020-12-09 DIAGNOSIS — Z23 Encounter for immunization: Secondary | ICD-10-CM

## 2020-12-09 DIAGNOSIS — Z0001 Encounter for general adult medical examination with abnormal findings: Secondary | ICD-10-CM | POA: Insufficient documentation

## 2020-12-09 DIAGNOSIS — Z1231 Encounter for screening mammogram for malignant neoplasm of breast: Secondary | ICD-10-CM | POA: Insufficient documentation

## 2020-12-09 LAB — LIPID PANEL
Cholesterol: 198 mg/dL (ref 0–200)
HDL: 66.4 mg/dL (ref >39.00)
LDL Cholesterol: 111 mg/dL — ABNORMAL HIGH (ref 0–99)
NonHDL: 131.74
Total CHOL/HDL Ratio: 3
Triglycerides: 102 mg/dL (ref 0.0–149.0)
VLDL: 20.4 mg/dL (ref 0.0–40.0)

## 2020-12-09 LAB — CBC WITH DIFFERENTIAL/PLATELET
Basophils Absolute: 0 10*3/uL (ref 0.0–0.1)
Basophils Relative: 0.4 % (ref 0.0–3.0)
Eosinophils Absolute: 0.3 10*3/uL (ref 0.0–0.7)
Eosinophils Relative: 6.2 % — ABNORMAL HIGH (ref 0.0–5.0)
HCT: 37.6 % (ref 36.0–46.0)
Hemoglobin: 12.4 g/dL (ref 12.0–15.0)
Lymphocytes Relative: 35.9 % (ref 12.0–46.0)
Lymphs Abs: 1.5 10*3/uL (ref 0.7–4.0)
MCHC: 33 g/dL (ref 30.0–36.0)
MCV: 93.5 fl (ref 78.0–100.0)
Monocytes Absolute: 0.3 10*3/uL (ref 0.1–1.0)
Monocytes Relative: 7.4 % (ref 3.0–12.0)
Neutro Abs: 2.2 10*3/uL (ref 1.4–7.7)
Neutrophils Relative %: 50.1 % (ref 43.0–77.0)
Platelets: 226 10*3/uL (ref 150.0–400.0)
RBC: 4.02 Mil/uL (ref 3.87–5.11)
RDW: 13.5 % (ref 11.5–15.5)
WBC: 4.3 10*3/uL (ref 4.0–10.5)

## 2020-12-09 LAB — BASIC METABOLIC PANEL
BUN: 16 mg/dL (ref 6–23)
CO2: 31 mEq/L (ref 19–32)
Calcium: 9.3 mg/dL (ref 8.4–10.5)
Chloride: 107 mEq/L (ref 96–112)
Creatinine, Ser: 0.73 mg/dL (ref 0.40–1.20)
GFR: 82.25 mL/min (ref >60.00)
Glucose, Bld: 80 mg/dL (ref 70–99)
Potassium: 3.8 mEq/L (ref 3.5–5.1)
Sodium: 142 mEq/L (ref 135–145)

## 2020-12-09 LAB — VITAMIN D 25 HYDROXY (VIT D DEFICIENCY, FRACTURES): VITD: 34.09 ng/mL (ref 30.00–100.00)

## 2020-12-09 LAB — TSH: TSH: 1.98 u[IU]/mL (ref 0.35–5.50)

## 2020-12-09 NOTE — Progress Notes (Signed)
Subjective:  Patient ID: Deborah Glass, female    DOB: 09/29/1950  Age: 70 y.o. MRN: [id number]  CC: Annual Exam  This visit occurred during the SARS-CoV-2 public health emergency.  Safety protocols were in place, including screening questions prior to the visit, additional usage of staff PPE, and extensive cleaning of exam room while observing appropriate contact time as indicated for disinfecting solutions.    HPI Jalilah Reneka Nebergall presents for a CPX and to establish.  She complains of a 59-month history of insomnia with difficulty falling asleep and frequent awakenings.  She has gotten some symptom relief with melatonin.  She otherwise feels well and offers no other complaints.  History Chastity has no past medical history on file.   She has a past surgical history that includes Breast excisional biopsy (Right).   Her family history includes Stomach cancer in her father.She reports that she has never smoked. She has never used smokeless tobacco. She reports that she does not drink alcohol and does not use drugs.  Outpatient Medications Prior to Visit  Medication Sig Dispense Refill   cholecalciferol (VITAMIN D3) 25 MCG (1000 UNIT) tablet Take 1 tablet (1,000 Units total) by mouth daily. 90 tablet 1   No facility-administered medications prior to visit.    ROS Review of Systems  Constitutional:  Negative for appetite change, diaphoresis, fatigue and unexpected weight change.  HENT: Negative.    Eyes: Negative.   Respiratory:  Negative for cough, chest tightness, shortness of breath and wheezing.   Cardiovascular:  Negative for chest pain, palpitations and leg swelling.  Gastrointestinal:  Negative for abdominal pain, constipation, diarrhea and vomiting.  Endocrine: Negative.   Genitourinary: Negative.  Negative for difficulty urinating.  Musculoskeletal:  Negative for arthralgias, joint swelling and myalgias.  Skin: Negative.  Negative for color change and pallor.  Neurological:  Negative.  Negative for dizziness, weakness, light-headedness and headaches.  Hematological:  Negative for adenopathy. Does not bruise/bleed easily.  Psychiatric/Behavioral:  Positive for sleep disturbance. Negative for behavioral problems, dysphoric mood and suicidal ideas. The patient is not nervous/anxious.    Objective:  BP 126/74 (BP Location: Left Arm, Patient Position: Sitting, Cuff Size: Normal)   Pulse 74   Temp 98.6 F (37 C) (Oral)   Resp 16   Ht 4\' 9"  (1.448 m)   Wt 108 lb (49 kg)   SpO2 97%   BMI 23.37 kg/m   Physical Exam Vitals reviewed.  HENT:     Nose: Nose normal.     Mouth/Throat:     Mouth: Mucous membranes are moist.  Eyes:     General: No scleral icterus.    Conjunctiva/sclera: Conjunctivae normal.  Cardiovascular:     Rate and Rhythm: Normal rate and regular rhythm.     Heart sounds: No murmur heard. Pulmonary:     Effort: Pulmonary effort is normal.     Breath sounds: No stridor. No wheezing, rhonchi or rales.  Abdominal:     General: Abdomen is flat.     Palpations: There is no mass.     Tenderness: There is no abdominal tenderness. There is no guarding.     Hernia: No hernia is present.  Musculoskeletal:        General: Normal range of motion.     Cervical back: Neck supple.     Right lower leg: No edema.     Left lower leg: No edema.  Lymphadenopathy:     Cervical: No cervical adenopathy.  Skin:  General: Skin is warm and dry.  Neurological:     General: No focal deficit present.     Mental Status: She is alert.  Psychiatric:        Mood and Affect: Mood normal.        Behavior: Behavior normal.    Lab Results  Component Value Date   WBC 4.3 12/09/2020   HGB 12.4 12/09/2020   HCT 37.6 12/09/2020   PLT 226.0 12/09/2020   GLUCOSE 80 12/09/2020   CHOL 198 12/09/2020   TRIG 102.0 12/09/2020   HDL 66.40 12/09/2020   LDLCALC 111 (H) 12/09/2020   ALT 18 07/04/2016   AST 26 07/04/2016   NA 142 12/09/2020   K 3.8 12/09/2020   CL  107 12/09/2020   CREATININE 0.73 12/09/2020   BUN 16 12/09/2020   CO2 31 12/09/2020   TSH 1.98 12/09/2020   HGBA1C 5.4 01/10/2020     Assessment & Plan:   Undine was seen today for annual exam.  Diagnoses and all orders for this visit:  Vitamin D deficiency- Her vitamin D level is normal now. -     Cancel: Flu Vaccine QUAD High Dose(Fluad) -     Basic metabolic panel; Future -     VITAMIN D 25 Hydroxy (Vit-D Deficiency, Fractures); Future -     VITAMIN D 25 Hydroxy (Vit-D Deficiency, Fractures) -     Basic metabolic panel  Psychophysiological insomnia- Labs are negative for secondary causes.  I recommended that she treat this with suvorexant. -     CBC with Differential/Platelet; Future -     CBC with Differential/Platelet -     Suvorexant (BELSOMRA) 10 MG TABS; Take 10 mg by mouth at bedtime as needed.  Hyperlipidemia with target LDL less than 130- She does not have an elevated ASCVD risk score so I did not recommend a statin for CV risk reduction. -     Lipid panel; Future -     TSH; Future -     TSH -     Lipid panel  Encounter for general adult medical examination with abnormal findings- Exam completed, labs reviewed, vaccines reviewed and updated, cancer screenings are up-to-date, patient education was given.  Visit for screening mammogram -     MM DIGITAL SCREENING BILATERAL; Future  Need for prophylactic vaccination with combined diphtheria-tetanus-pertussis (DTP) vaccine -     Tdap (BOOSTRIX) 5-2.5-18.5 LF-MCG/0.5 injection; Inject 0.5 mLs into the muscle once for 1 dose.  I am having Kenesha T. Cyndie Chime start on Boostrix and Belsomra. I am also having her maintain her cholecalciferol.  Meds ordered this encounter  Medications   Tdap (BOOSTRIX) 5-2.5-18.5 LF-MCG/0.5 injection    Sig: Inject 0.5 mLs into the muscle once for 1 dose.    Dispense:  0.5 mL    Refill:  0   Suvorexant (BELSOMRA) 10 MG TABS    Sig: Take 10 mg by mouth at bedtime as needed.    Dispense:  90  tablet    Refill:  0      Follow-up: Return in about 6 months (around 06/08/2021).  Sanda Linger, MD

## 2020-12-09 NOTE — Patient Instructions (Signed)
Health Maintenance, Female Adopting a healthy lifestyle and getting preventive care are important in promoting health and wellness. Ask your health care provider about: The right schedule for you to have regular tests and exams. Things you can do on your own to prevent diseases and keep yourself healthy. What should I know about diet, weight, and exercise? Eat a healthy diet  Eat a diet that includes plenty of vegetables, fruits, low-fat dairy products, and lean protein. Do not eat a lot of foods that are high in solid fats, added sugars, or sodium. Maintain a healthy weight Body mass index (BMI) is used to identify weight problems. It estimates body fat based on height and weight. Your health care provider can help determine your BMI and help you achieve or maintain a healthy weight. Get regular exercise Get regular exercise. This is one of the most important things you can do for your health. Most adults should: Exercise for at least 150 minutes each week. The exercise should increase your heart rate and make you sweat (moderate-intensity exercise). Do strengthening exercises at least twice a week. This is in addition to the moderate-intensity exercise. Spend less time sitting. Even light physical activity can be beneficial. Watch cholesterol and blood lipids Have your blood tested for lipids and cholesterol at 72 years of age, then have this test every 5 years. Have your cholesterol levels checked more often if: Your lipid or cholesterol levels are high. You are older than 72 years of age. You are at high risk for heart disease. What should I know about cancer screening? Depending on your health history and family history, you may need to have cancer screening at various ages. This may include screening for: Breast cancer. Cervical cancer. Colorectal cancer. Skin cancer. Lung cancer. What should I know about heart disease, diabetes, and high blood pressure? Blood pressure and heart  disease High blood pressure causes heart disease and increases the risk of stroke. This is more likely to develop in people who have high blood pressure readings, are of African descent, or are overweight. Have your blood pressure checked: Every 3-5 years if you are 18-39 years of age. Every year if you are 40 years old or older. Diabetes Have regular diabetes screenings. This checks your fasting blood sugar level. Have the screening done: Once every three years after age 40 if you are at a normal weight and have a low risk for diabetes. More often and at a younger age if you are overweight or have a high risk for diabetes. What should I know about preventing infection? Hepatitis B If you have a higher risk for hepatitis B, you should be screened for this virus. Talk with your health care provider to find out if you are at risk for hepatitis B infection. Hepatitis C Testing is recommended for: Everyone born from 1945 through 1965. Anyone with known risk factors for hepatitis C. Sexually transmitted infections (STIs) Get screened for STIs, including gonorrhea and chlamydia, if: You are sexually active and are younger than 72 years of age. You are older than 72 years of age and your health care provider tells you that you are at risk for this type of infection. Your sexual activity has changed since you were last screened, and you are at increased risk for chlamydia or gonorrhea. Ask your health care provider if you are at risk. Ask your health care provider about whether you are at high risk for HIV. Your health care provider may recommend a prescription medicine   to help prevent HIV infection. If you choose to take medicine to prevent HIV, you should first get tested for HIV. You should then be tested every 3 months for as Nehal as you are taking the medicine. Pregnancy If you are about to stop having your period (premenopausal) and you may become pregnant, seek counseling before you get  pregnant. Take 400 to 800 micrograms (mcg) of folic acid every day if you become pregnant. Ask for birth control (contraception) if you want to prevent pregnancy. Osteoporosis and menopause Osteoporosis is a disease in which the bones lose minerals and strength with aging. This can result in bone fractures. If you are 65 years old or older, or if you are at risk for osteoporosis and fractures, ask your health care provider if you should: Be screened for bone loss. Take a calcium or vitamin D supplement to lower your risk of fractures. Be given hormone replacement therapy (HRT) to treat symptoms of menopause. Follow these instructions at home: Lifestyle Do not use any products that contain nicotine or tobacco, such as cigarettes, e-cigarettes, and chewing tobacco. If you need help quitting, ask your health care provider. Do not use street drugs. Do not share needles. Ask your health care provider for help if you need support or information about quitting drugs. Alcohol use Do not drink alcohol if: Your health care provider tells you not to drink. You are pregnant, may be pregnant, or are planning to become pregnant. If you drink alcohol: Limit how much you use to 0-1 drink a day. Limit intake if you are breastfeeding. Be aware of how much alcohol is in your drink. In the [place] one drink equals one 12 oz bottle of beer (355 mL), one 5 oz glass of wine (148 mL), or one 1 oz glass of hard liquor (44 mL). General instructions Schedule regular health, dental, and eye exams. Stay current with your vaccines. Tell your health care provider if: You often feel depressed. You have ever been abused or do not feel safe at home. Summary Adopting a healthy lifestyle and getting preventive care are important in promoting health and wellness. Follow your health care provider's instructions about healthy diet, exercising, and getting tested or screened for diseases. Follow your health care provider's  instructions on monitoring your cholesterol and blood pressure. This information is not intended to replace advice given to you by your health care provider. Make sure you discuss any questions you have with your health care provider. Document Revised: 05/17/2020 Document Reviewed: 03/02/2018 [employer] Patient Education  2022 [employer]  

## 2020-12-10 ENCOUNTER — Encounter: Payer: Self-pay | Admitting: Internal Medicine

## 2020-12-10 MED ORDER — BELSOMRA 10 MG PO TABS: 10.0000 mg | ORAL_TABLET | Freq: Every evening | ORAL | 0 refills | Status: DC | PRN

## 2020-12-10 MED ORDER — BOOSTRIX 5-2.5-18.5 LF-MCG/0.5 IM SUSP: 0.5000 mL | Freq: Once | INTRAMUSCULAR | 0 refills | Status: AC

## 2021-01-20 ENCOUNTER — Other Ambulatory Visit (HOSPITAL_COMMUNITY): Payer: Medicare Other

## 2021-01-20 ENCOUNTER — Inpatient Hospital Stay (HOSPITAL_COMMUNITY)
Admission: EM | Admit: 2021-01-20 | Discharge: 2021-01-24 | DRG: 853 | Disposition: A | Discharge status: Home / Self Care | Payer: Medicare Other | Attending: Internal Medicine | Admitting: Internal Medicine

## 2021-01-20 ENCOUNTER — Inpatient Hospital Stay (HOSPITAL_COMMUNITY): Payer: Medicare Other

## 2021-01-20 ENCOUNTER — Encounter (HOSPITAL_COMMUNITY): Payer: Self-pay

## 2021-01-20 ENCOUNTER — Inpatient Hospital Stay (HOSPITAL_COMMUNITY): Payer: Medicare Other | Admitting: Certified Registered"

## 2021-01-20 ENCOUNTER — Other Ambulatory Visit: Payer: Self-pay

## 2021-01-20 ENCOUNTER — Emergency Department (HOSPITAL_COMMUNITY): Payer: Medicare Other

## 2021-01-20 ENCOUNTER — Encounter (HOSPITAL_COMMUNITY)
Admission: EM | Disposition: A | Discharge status: Home / Self Care | Payer: Self-pay | Source: Home / Self Care | Attending: Internal Medicine

## 2021-01-20 DIAGNOSIS — N210 Calculus in bladder: Secondary | ICD-10-CM | POA: Diagnosis present

## 2021-01-20 DIAGNOSIS — R0602 Shortness of breath: Secondary | ICD-10-CM | POA: Diagnosis not present

## 2021-01-20 DIAGNOSIS — I248 Other forms of acute ischemic heart disease: Secondary | ICD-10-CM | POA: Diagnosis present

## 2021-01-20 DIAGNOSIS — N200 Calculus of kidney: Secondary | ICD-10-CM | POA: Diagnosis not present

## 2021-01-20 DIAGNOSIS — R739 Hyperglycemia, unspecified: Secondary | ICD-10-CM | POA: Diagnosis present

## 2021-01-20 DIAGNOSIS — N136 Pyonephrosis: Secondary | ICD-10-CM | POA: Diagnosis present

## 2021-01-20 DIAGNOSIS — Z1611 Resistance to penicillins: Secondary | ICD-10-CM | POA: Diagnosis not present

## 2021-01-20 DIAGNOSIS — E785 Hyperlipidemia, unspecified: Secondary | ICD-10-CM | POA: Diagnosis present

## 2021-01-20 DIAGNOSIS — J45909 Unspecified asthma, uncomplicated: Secondary | ICD-10-CM | POA: Diagnosis present

## 2021-01-20 DIAGNOSIS — J9601 Acute respiratory failure with hypoxia: Secondary | ICD-10-CM | POA: Diagnosis present

## 2021-01-20 DIAGNOSIS — D649 Anemia, unspecified: Secondary | ICD-10-CM | POA: Diagnosis not present

## 2021-01-20 DIAGNOSIS — E878 Other disorders of electrolyte and fluid balance, not elsewhere classified: Secondary | ICD-10-CM | POA: Diagnosis present

## 2021-01-20 DIAGNOSIS — K5900 Constipation, unspecified: Secondary | ICD-10-CM | POA: Diagnosis present

## 2021-01-20 DIAGNOSIS — N201 Calculus of ureter: Secondary | ICD-10-CM | POA: Diagnosis present

## 2021-01-20 DIAGNOSIS — R509 Fever, unspecified: Secondary | ICD-10-CM | POA: Diagnosis not present

## 2021-01-20 DIAGNOSIS — N132 Hydronephrosis with renal and ureteral calculous obstruction: Secondary | ICD-10-CM | POA: Diagnosis not present

## 2021-01-20 DIAGNOSIS — D696 Thrombocytopenia, unspecified: Secondary | ICD-10-CM | POA: Diagnosis not present

## 2021-01-20 DIAGNOSIS — N049 Nephrotic syndrome with unspecified morphologic changes: Secondary | ICD-10-CM | POA: Diagnosis not present

## 2021-01-20 DIAGNOSIS — E876 Hypokalemia: Secondary | ICD-10-CM | POA: Diagnosis not present

## 2021-01-20 DIAGNOSIS — D6959 Other secondary thrombocytopenia: Secondary | ICD-10-CM | POA: Diagnosis present

## 2021-01-20 DIAGNOSIS — K222 Esophageal obstruction: Secondary | ICD-10-CM | POA: Diagnosis not present

## 2021-01-20 DIAGNOSIS — R0600 Dyspnea, unspecified: Secondary | ICD-10-CM | POA: Diagnosis not present

## 2021-01-20 DIAGNOSIS — E8729 Other acidosis: Secondary | ICD-10-CM | POA: Diagnosis present

## 2021-01-20 DIAGNOSIS — J811 Chronic pulmonary edema: Secondary | ICD-10-CM | POA: Diagnosis not present

## 2021-01-20 DIAGNOSIS — Z452 Encounter for adjustment and management of vascular access device: Secondary | ICD-10-CM

## 2021-01-20 DIAGNOSIS — A419 Sepsis, unspecified organism: Secondary | ICD-10-CM

## 2021-01-20 DIAGNOSIS — M47816 Spondylosis without myelopathy or radiculopathy, lumbar region: Secondary | ICD-10-CM | POA: Diagnosis not present

## 2021-01-20 DIAGNOSIS — R6521 Severe sepsis with septic shock: Secondary | ICD-10-CM | POA: Diagnosis not present

## 2021-01-20 DIAGNOSIS — N10 Acute tubulo-interstitial nephritis: Secondary | ICD-10-CM | POA: Diagnosis not present

## 2021-01-20 DIAGNOSIS — R630 Anorexia: Secondary | ICD-10-CM | POA: Diagnosis not present

## 2021-01-20 DIAGNOSIS — Z800 Family history of malignant neoplasm of digestive organs: Secondary | ICD-10-CM

## 2021-01-20 DIAGNOSIS — R519 Headache, unspecified: Secondary | ICD-10-CM | POA: Diagnosis not present

## 2021-01-20 DIAGNOSIS — Z20822 Contact with and (suspected) exposure to covid-19: Secondary | ICD-10-CM | POA: Diagnosis not present

## 2021-01-20 DIAGNOSIS — B9620 Unspecified Escherichia coli [E. coli] as the cause of diseases classified elsewhere: Secondary | ICD-10-CM

## 2021-01-20 DIAGNOSIS — R7881 Bacteremia: Secondary | ICD-10-CM | POA: Diagnosis not present

## 2021-01-20 DIAGNOSIS — R778 Other specified abnormalities of plasma proteins: Secondary | ICD-10-CM | POA: Diagnosis not present

## 2021-01-20 DIAGNOSIS — E559 Vitamin D deficiency, unspecified: Secondary | ICD-10-CM | POA: Diagnosis not present

## 2021-01-20 DIAGNOSIS — N12 Tubulo-interstitial nephritis, not specified as acute or chronic: Secondary | ICD-10-CM | POA: Diagnosis not present

## 2021-01-20 DIAGNOSIS — A4151 Sepsis due to Escherichia coli [E. coli]: Principal | ICD-10-CM | POA: Diagnosis present

## 2021-01-20 DIAGNOSIS — N1330 Unspecified hydronephrosis: Secondary | ICD-10-CM | POA: Diagnosis not present

## 2021-01-20 DIAGNOSIS — I361 Nonrheumatic tricuspid (valve) insufficiency: Secondary | ICD-10-CM | POA: Diagnosis not present

## 2021-01-20 HISTORY — PX: CYSTOSCOPY W/ URETERAL STENT PLACEMENT: SHX1429

## 2021-01-20 LAB — BLOOD CULTURE ID PANEL (REFLEXED) - BCID2

## 2021-01-20 LAB — CBC WITH DIFFERENTIAL/PLATELET
Abs Immature Granulocytes: 0.03 10*3/uL (ref 0.00–0.07)
Basophils Absolute: 0 10*3/uL (ref 0.0–0.1)
Basophils Relative: 0 %
Eosinophils Absolute: 0 10*3/uL (ref 0.0–0.5)
Eosinophils Relative: 0 %
HCT: 36.5 % (ref 36.0–46.0)
Hemoglobin: 12.2 g/dL (ref 12.0–15.0)
Immature Granulocytes: 0 %
Lymphocytes Relative: 6 %
Lymphs Abs: 0.6 10*3/uL — ABNORMAL LOW (ref 0.7–4.0)
MCH: 31.6 pg (ref 26.0–34.0)
MCHC: 33.4 g/dL (ref 30.0–36.0)
MCV: 94.6 fL (ref 80.0–100.0)
Monocytes Absolute: 0.4 10*3/uL (ref 0.1–1.0)
Monocytes Relative: 4 %
Neutro Abs: 9.2 10*3/uL — ABNORMAL HIGH (ref 1.7–7.7)
Neutrophils Relative %: 90 %
Platelets: 228 10*3/uL (ref 150–400)
RBC: 3.86 MIL/uL — ABNORMAL LOW (ref 3.87–5.11)
RDW: 12.7 % (ref 11.5–15.5)
WBC: 10.3 10*3/uL (ref 4.0–10.5)
nRBC: 0 % (ref 0.0–0.2)

## 2021-01-20 LAB — BASIC METABOLIC PANEL
Anion gap: 6 (ref 5–15)
BUN: 18 mg/dL (ref 8–23)
CO2: 17 mmol/L — ABNORMAL LOW (ref 22–32)
Calcium: 6.9 mg/dL — ABNORMAL LOW (ref 8.9–10.3)
Chloride: 116 mmol/L — ABNORMAL HIGH (ref 98–111)
Creatinine, Ser: 0.87 mg/dL (ref 0.44–1.00)
GFR, Estimated: >60 mL/min (ref >60)
Glucose, Bld: 126 mg/dL — ABNORMAL HIGH (ref 70–99)
Potassium: 3.7 mmol/L (ref 3.5–5.1)
Sodium: 139 mmol/L (ref 135–145)

## 2021-01-20 LAB — TROPONIN I (HIGH SENSITIVITY)
Troponin I (High Sensitivity): 327 ng/L (ref <18)
Troponin I (High Sensitivity): 201 ng/L (ref <18)

## 2021-01-20 LAB — BLOOD GAS, ARTERIAL
Acid-base deficit: 7.6 mmol/L — ABNORMAL HIGH (ref 0.0–2.0)
Bicarbonate: 17.4 mmol/L — ABNORMAL LOW (ref 20.0–28.0)
FIO2: 30
Mode: POSITIVE
O2 Saturation: 95.6 %
Patient temperature: 99.2
RATE: 12 resp/min
pCO2 arterial: 35.8 mmHg (ref 32.0–48.0)
pH, Arterial: 7.309 — ABNORMAL LOW (ref 7.350–7.450)
pO2, Arterial: 88.9 mmHg (ref 83.0–108.0)

## 2021-01-20 LAB — URINALYSIS, ROUTINE W REFLEX MICROSCOPIC
Bacteria, UA: NONE SEEN
Bilirubin Urine: NEGATIVE
Glucose, UA: 50 mg/dL — AB
Ketones, ur: 20 mg/dL — AB
Leukocytes,Ua: NEGATIVE
Nitrite: NEGATIVE
Protein, ur: NEGATIVE mg/dL
Specific Gravity, Urine: 1.04 — ABNORMAL HIGH (ref 1.005–1.030)
pH: 6 (ref 5.0–8.0)

## 2021-01-20 LAB — COMPREHENSIVE METABOLIC PANEL
ALT: 15 U/L (ref 0–44)
AST: 21 U/L (ref 15–41)
Albumin: 4.2 g/dL (ref 3.5–5.0)
Alkaline Phosphatase: 64 U/L (ref 38–126)
Anion gap: 7 (ref 5–15)
BUN: 20 mg/dL (ref 8–23)
CO2: 25 mmol/L (ref 22–32)
Calcium: 8.8 mg/dL — ABNORMAL LOW (ref 8.9–10.3)
Chloride: 103 mmol/L (ref 98–111)
Creatinine, Ser: 0.89 mg/dL (ref 0.44–1.00)
GFR, Estimated: >60 mL/min (ref >60)
Glucose, Bld: 160 mg/dL — ABNORMAL HIGH (ref 70–99)
Potassium: 3.4 mmol/L — ABNORMAL LOW (ref 3.5–5.1)
Sodium: 135 mmol/L (ref 135–145)
Total Bilirubin: 0.5 mg/dL (ref 0.3–1.2)
Total Protein: 7.7 g/dL (ref 6.5–8.1)

## 2021-01-20 LAB — MRSA NEXT GEN BY PCR, NASAL
MRSA by PCR Next Gen: NOT DETECTED
MRSA by PCR Next Gen: NOT DETECTED

## 2021-01-20 LAB — LACTIC ACID, PLASMA
Lactic Acid, Venous: 1.9 mmol/L (ref 0.5–1.9)
Lactic Acid, Venous: 1.9 mmol/L (ref 0.5–1.9)
Lactic Acid, Venous: 1.6 mmol/L (ref 0.5–1.9)

## 2021-01-20 LAB — RESP PANEL BY RT-PCR (FLU A&B, COVID) ARPGX2
Influenza A by PCR: NEGATIVE
Influenza B by PCR: NEGATIVE
SARS Coronavirus 2 by RT PCR: NEGATIVE

## 2021-01-20 LAB — MAGNESIUM: Magnesium: 2.2 mg/dL (ref 1.7–2.4)

## 2021-01-20 LAB — GLUCOSE, CAPILLARY
Glucose-Capillary: 104 mg/dL — ABNORMAL HIGH (ref 70–99)
Glucose-Capillary: 104 mg/dL — ABNORMAL HIGH (ref 70–99)

## 2021-01-20 LAB — HEMOGLOBIN A1C
Hgb A1c MFr Bld: 5.4 % (ref 4.8–5.6)
Mean Plasma Glucose: 108.28 mg/dL

## 2021-01-20 LAB — STREP PNEUMONIAE URINARY ANTIGEN: Strep Pneumo Urinary Antigen: NEGATIVE

## 2021-01-20 LAB — LIPASE, BLOOD: Lipase: 43 U/L (ref 11–51)

## 2021-01-20 LAB — D-DIMER, QUANTITATIVE: D-Dimer, Quant: 9.33 ug/mL-FEU — ABNORMAL HIGH (ref 0.00–0.50)

## 2021-01-20 LAB — BRAIN NATRIURETIC PEPTIDE: B Natriuretic Peptide: 718.5 pg/mL — ABNORMAL HIGH (ref 0.0–100.0)

## 2021-01-20 SURGERY — CYSTOSCOPY, FLEXIBLE, WITH STENT REPLACEMENT
Anesthesia: General | Laterality: Right

## 2021-01-20 MED ORDER — KETOROLAC TROMETHAMINE 15 MG/ML IJ SOLN
7.5000 mg | Freq: Once | INTRAMUSCULAR | Status: AC
  Administered 2021-01-20: 7.5 mg via INTRAVENOUS
  Filled 2021-01-20: qty 1

## 2021-01-20 MED ORDER — ALBUTEROL SULFATE (2.5 MG/3ML) 0.083% IN NEBU
2.5000 mg | INHALATION_SOLUTION | RESPIRATORY_TRACT | Status: DC | PRN
  Administered 2021-01-21: 2.5 mg via RESPIRATORY_TRACT
  Filled 2021-01-20: qty 3

## 2021-01-20 MED ORDER — FENTANYL CITRATE (PF) 100 MCG/2ML IJ SOLN
INTRAMUSCULAR | Status: AC
  Filled 2021-01-20: qty 2

## 2021-01-20 MED ORDER — PHENYLEPHRINE 40 MCG/ML (10ML) SYRINGE FOR IV PUSH (FOR BLOOD PRESSURE SUPPORT)
PREFILLED_SYRINGE | INTRAVENOUS | Status: AC
  Filled 2021-01-20: qty 20

## 2021-01-20 MED ORDER — LACTATED RINGERS IV SOLN: INTRAVENOUS | Status: AC

## 2021-01-20 MED ORDER — FENTANYL CITRATE (PF) 100 MCG/2ML IJ SOLN
INTRAMUSCULAR | Status: DC | PRN
  Administered 2021-01-20: 25 ug via INTRAVENOUS

## 2021-01-20 MED ORDER — LIDOCAINE HCL (PF) 2 % IJ SOLN
INTRAMUSCULAR | Status: AC
  Filled 2021-01-20: qty 10

## 2021-01-20 MED ORDER — PHENYLEPHRINE 40 MCG/ML (10ML) SYRINGE FOR IV PUSH (FOR BLOOD PRESSURE SUPPORT)
PREFILLED_SYRINGE | INTRAVENOUS | Status: DC | PRN
  Administered 2021-01-20 (×5): 80 ug via INTRAVENOUS

## 2021-01-20 MED ORDER — ACETAMINOPHEN 500 MG PO TABS
1000.0000 mg | ORAL_TABLET | Freq: Once | ORAL | Status: AC
  Administered 2021-01-20: 1000 mg via ORAL
  Filled 2021-01-20: qty 2

## 2021-01-20 MED ORDER — ACETAMINOPHEN 650 MG RE SUPP: 650.0000 mg | Freq: Four times a day (QID) | RECTAL | Status: DC | PRN

## 2021-01-20 MED ORDER — VANCOMYCIN HCL IN DEXTROSE 1-5 GM/200ML-% IV SOLN
1000.0000 mg | Freq: Once | INTRAVENOUS | Status: AC
  Administered 2021-01-20: 1000 mg via INTRAVENOUS
  Filled 2021-01-20: qty 200

## 2021-01-20 MED ORDER — ONDANSETRON HCL 4 MG/2ML IJ SOLN
4.0000 mg | Freq: Four times a day (QID) | INTRAMUSCULAR | Status: DC | PRN
  Administered 2021-01-20: 4 mg via INTRAVENOUS

## 2021-01-20 MED ORDER — IOHEXOL 350 MG/ML SOLN
75.0000 mL | Freq: Once | INTRAVENOUS | Status: AC | PRN
  Administered 2021-01-20: 75 mL via INTRAVENOUS

## 2021-01-20 MED ORDER — LIDOCAINE HCL (PF) 2 % IJ SOLN
INTRAMUSCULAR | Status: DC | PRN
  Administered 2021-01-20 (×2): 40 mg via INTRADERMAL

## 2021-01-20 MED ORDER — SODIUM CHLORIDE 0.9 % IV SOLN
2.0000 g | INTRAVENOUS | Status: DC
  Administered 2021-01-21: 2 g via INTRAVENOUS
  Filled 2021-01-20 (×2): qty 20

## 2021-01-20 MED ORDER — FENTANYL CITRATE PF 50 MCG/ML IJ SOSY: 50.0000 ug | PREFILLED_SYRINGE | Freq: Once | INTRAMUSCULAR | Status: DC

## 2021-01-20 MED ORDER — POTASSIUM CHLORIDE 10 MEQ/100ML IV SOLN
10.0000 meq | INTRAVENOUS | Status: AC
  Administered 2021-01-21 (×2): 10 meq via INTRAVENOUS
  Filled 2021-01-20 (×2): qty 100

## 2021-01-20 MED ORDER — ENOXAPARIN SODIUM 40 MG/0.4ML IJ SOSY: 40.0000 mg | PREFILLED_SYRINGE | INTRAMUSCULAR | Status: DC

## 2021-01-20 MED ORDER — SODIUM CHLORIDE 0.9 % IV BOLUS
1000.0000 mL | Freq: Once | INTRAVENOUS | Status: AC
  Administered 2021-01-20: 1000 mL via INTRAVENOUS

## 2021-01-20 MED ORDER — NOREPINEPHRINE 4 MG/250ML-% IV SOLN: 2.0000 ug/min | INTRAVENOUS | Status: DC

## 2021-01-20 MED ORDER — VANCOMYCIN HCL 500 MG/100ML IV SOLN: 500.0000 mg | INTRAVENOUS | Status: DC

## 2021-01-20 MED ORDER — CHLORHEXIDINE GLUCONATE 0.12 % MT SOLN
15.0000 mL | Freq: Two times a day (BID) | OROMUCOSAL | Status: DC
  Administered 2021-01-20 – 2021-01-21 (×2): 15 mL via OROMUCOSAL
  Filled 2021-01-20 (×2): qty 15

## 2021-01-20 MED ORDER — SODIUM CHLORIDE 0.9 % IV SOLN
250.0000 mL | INTRAVENOUS | Status: DC
  Administered 2021-01-20 – 2021-01-22 (×3): 250 mL via INTRAVENOUS

## 2021-01-20 MED ORDER — SUCCINYLCHOLINE CHLORIDE 200 MG/10ML IV SOSY
PREFILLED_SYRINGE | INTRAVENOUS | Status: DC | PRN
  Administered 2021-01-20: 80 mg via INTRAVENOUS

## 2021-01-20 MED ORDER — ORAL CARE MOUTH RINSE: 15.0000 mL | Freq: Two times a day (BID) | OROMUCOSAL | Status: DC

## 2021-01-20 MED ORDER — IOHEXOL 300 MG/ML  SOLN
INTRAMUSCULAR | Status: DC | PRN
  Administered 2021-01-20: 5 mL via URETHRAL

## 2021-01-20 MED ORDER — VASOPRESSIN 20 UNIT/ML IV SOLN
INTRAVENOUS | Status: DC | PRN
  Administered 2021-01-20: 1 [IU] via INTRAVENOUS

## 2021-01-20 MED ORDER — VASOPRESSIN 20 UNIT/ML IV SOLN
INTRAVENOUS | Status: AC
  Filled 2021-01-20: qty 1

## 2021-01-20 MED ORDER — SODIUM CHLORIDE 0.9 % IV SOLN
2.0000 g | Freq: Two times a day (BID) | INTRAVENOUS | Status: DC
  Administered 2021-01-20: 2 g via INTRAVENOUS
  Filled 2021-01-20: qty 2

## 2021-01-20 MED ORDER — PROPOFOL 10 MG/ML IV BOLUS
INTRAVENOUS | Status: DC | PRN
  Administered 2021-01-20: 70 mg via INTRAVENOUS
  Administered 2021-01-20: 20 mg via INTRAVENOUS
  Administered 2021-01-20: 30 mg via INTRAVENOUS

## 2021-01-20 MED ORDER — ONDANSETRON HCL 4 MG/2ML IJ SOLN
4.0000 mg | Freq: Once | INTRAMUSCULAR | Status: AC
  Administered 2021-01-20: 4 mg via INTRAVENOUS
  Filled 2021-01-20: qty 2

## 2021-01-20 MED ORDER — METRONIDAZOLE 500 MG/100ML IV SOLN
500.0000 mg | Freq: Once | INTRAVENOUS | Status: AC
  Administered 2021-01-20: 500 mg via INTRAVENOUS
  Filled 2021-01-20: qty 100

## 2021-01-20 MED ORDER — TAMSULOSIN HCL 0.4 MG PO CAPS
0.4000 mg | ORAL_CAPSULE | Freq: Once | ORAL | Status: AC
  Administered 2021-01-20: 0.4 mg via ORAL
  Filled 2021-01-20: qty 1

## 2021-01-20 MED ORDER — SODIUM CHLORIDE 0.9 % IV BOLUS
500.0000 mL | Freq: Once | INTRAVENOUS | Status: AC
  Administered 2021-01-20: 500 mL via INTRAVENOUS

## 2021-01-20 MED ORDER — INSULIN ASPART 100 UNIT/ML IJ SOLN
0.0000 [IU] | INTRAMUSCULAR | Status: DC
  Filled 2021-01-20: qty 0.06

## 2021-01-20 MED ORDER — NOREPINEPHRINE 4 MG/250ML-% IV SOLN
INTRAVENOUS | Status: AC
  Administered 2021-01-20: 2 ug/min via INTRAVENOUS
  Filled 2021-01-20: qty 250

## 2021-01-20 MED ORDER — EPHEDRINE 5 MG/ML INJ
INTRAVENOUS | Status: AC
  Filled 2021-01-20: qty 10

## 2021-01-20 MED ORDER — CHLORHEXIDINE GLUCONATE CLOTH 2 % EX PADS
6.0000 | MEDICATED_PAD | Freq: Every day | CUTANEOUS | Status: DC
  Administered 2021-01-20 – 2021-01-24 (×5): 6 via TOPICAL

## 2021-01-20 MED ORDER — ARTIFICIAL TEARS OPHTHALMIC OINT
TOPICAL_OINTMENT | OPHTHALMIC | Status: AC
  Filled 2021-01-20: qty 3.5

## 2021-01-20 MED ORDER — SODIUM CHLORIDE 0.9 % IV SOLN
2.0000 g | Freq: Once | INTRAVENOUS | Status: AC
  Administered 2021-01-20: 2 g via INTRAVENOUS
  Filled 2021-01-20: qty 2

## 2021-01-20 MED ORDER — POTASSIUM CHLORIDE 10 MEQ/100ML IV SOLN
10.0000 meq | INTRAVENOUS | Status: AC
  Administered 2021-01-20 (×4): 10 meq via INTRAVENOUS
  Filled 2021-01-20 (×4): qty 100

## 2021-01-20 SURGICAL SUPPLY — 14 items
BAG URO CATCHER STRL LF (MISCELLANEOUS) ×2 IMPLANT
CATH URET 5FR 28IN OPEN ENDED (CATHETERS) ×1 IMPLANT
CLOTH BEACON ORANGE TIMEOUT ST (SAFETY) ×2 IMPLANT
GLOVE SURG POLYISO LF SZ8 (GLOVE) IMPLANT
GOWN STRL REUS W/TWL XL LVL3 (GOWN DISPOSABLE) ×4 IMPLANT
GUIDEWIRE ANG ZIPWIRE 038X150 (WIRE) ×1 IMPLANT
GUIDEWIRE STR DUAL SENSOR (WIRE) ×2 IMPLANT
MANIFOLD NEPTUNE II (INSTRUMENTS) ×2 IMPLANT
PACK CYSTO (CUSTOM PROCEDURE TRAY) ×2 IMPLANT
STENT URET 6FRX22 CONTOUR (STENTS) IMPLANT
STENT URET 6FRX24 CONTOUR (STENTS) ×1 IMPLANT
TRAY FOLEY MTR SLVR 16FR STAT (SET/KITS/TRAYS/PACK) ×1 IMPLANT
TUBING CONNECTING 10 (TUBING) ×2 IMPLANT
TUBING UROLOGY SET (TUBING) IMPLANT

## 2021-01-20 NOTE — Consult Note (Signed)
Subjective: 1. Sepsis without acute organ dysfunction, due to unspecified organism (HCC)   2. Kidney stone   3. Hydronephrosis   4. Dyspnea      Consult requested by Dr. Kalman Shan.   Deborah Glass is a 70 yo female who had the onset yesterday of right flank pain, N/V and constipation.  She came to the ER early this morning.  She had fever to 103.2 while in the ER.   CT showed a 2mm right distal stone with mild hydronephrosis.   She has developed progressive sepsis requiring BiPAP and pressor support.  I was asked to see for stenting.  She has no prior history of stones, UTI's or GU surgery.     ROS:  Review of Systems  Constitutional:  Positive for fever.  Respiratory:  Positive for shortness of breath.   Gastrointestinal:  Positive for nausea and vomiting.  Genitourinary:  Positive for flank pain.  All other systems reviewed and are negative.  No Known Allergies  History reviewed. No pertinent past medical history.  Past Surgical History:  Procedure Laterality Date   BREAST EXCISIONAL BIOPSY Right     Social History   Socioeconomic History   Marital status: Married    Spouse name: Not on file   Number of children: Not on file   Years of education: Not on file   Highest education level: Not on file  Occupational History   Not on file  Tobacco Use   Smoking status: Never   Smokeless tobacco: Never  Substance and Sexual Activity   Alcohol use: No   Drug use: No   Sexual activity: Not on file  Other Topics Concern   Not on file  Social History Narrative   Not on file   Social Determinants of Health   Financial Resource Strain: Not on file  Food Insecurity: Not on file  Transportation Needs: Not on file  Physical Activity: Not on file  Stress: Not on file  Social Connections: Not on file  Intimate Partner Violence: Not on file    Family History  Problem Relation Age of Onset   Stomach cancer Father     Anti-infectives: Anti-infectives (From admission,  onward)    Start     Dose/Rate Route Frequency Ordered Stop   01/21/21 1000  vancomycin (VANCOREADY) IVPB 500 mg/100 mL        500 mg 100 mL/hr over 60 Minutes Intravenous Every 24 hours 01/20/21 1118     01/20/21 2100  ceFEPIme (MAXIPIME) 2 g in sodium chloride 0.9 % 100 mL IVPB        2 g 200 mL/hr over 30 Minutes Intravenous Every 12 hours 01/20/21 1118     01/20/21 0830  ceFEPIme (MAXIPIME) 2 g in sodium chloride 0.9 % 100 mL IVPB        2 g 200 mL/hr over 30 Minutes Intravenous  Once 01/20/21 0819 01/20/21 1111   01/20/21 0830  metroNIDAZOLE (FLAGYL) IVPB 500 mg        500 mg 100 mL/hr over 60 Minutes Intravenous  Once 01/20/21 0819 01/20/21 1112   01/20/21 0830  vancomycin (VANCOCIN) IVPB 1000 mg/200 mL premix        1,000 mg 200 mL/hr over 60 Minutes Intravenous  Once 01/20/21 8119 01/20/21 1112       Current Facility-Administered Medications  Medication Dose Route Frequency Provider Last Rate Last Admin   0.9 %  sodium chloride infusion  250 mL Intravenous Continuous Simonne Martinet, NP 10  mL/hr at 01/20/21 1610 250 mL at 01/20/21 1610   acetaminophen (TYLENOL) suppository 650 mg  650 mg Rectal Q6H PRN Ronaldo Miyamoto, Tyrone A, DO       ceFEPIme (MAXIPIME) 2 g in sodium chloride 0.9 % 100 mL IVPB  2 g Intravenous Q12H Simonne Martinet, NP       insulin aspart (novoLOG) injection 0-6 Units  0-6 Units Subcutaneous Q4H Simonne Martinet, NP       lactated ringers infusion   Intravenous Continuous Simonne Martinet, NP   Stopped at 01/20/21 1416   norepinephrine (LEVOPHED) 4mg  in premix infusion  2-10 mcg/min Intravenous Titrated , NP 18.75 mL/hr at 01/20/21 1502 5 mcg/min at 01/20/21 1502   ondansetron (ZOFRAN) injection 4 mg  4 mg Intravenous Q6H PRN 01/22/21, Tyrone A, DO       [START ON 01/21/2021] vancomycin (VANCOREADY) IVPB 500 mg/100 mL  500 mg Intravenous Q24H 13/03/2020, NP       Current Outpatient Medications  Medication Sig Dispense Refill    cholecalciferol (VITAMIN D3) 25 MCG (1000 UNIT) tablet Take 1 tablet (1,000 Units total) by mouth daily. 90 tablet 1   Suvorexant (BELSOMRA) 10 MG TABS Take 10 mg by mouth at bedtime as needed. 90 tablet 0     Objective: Vital signs in last 24 hours: BP (!) 94/54   Pulse 98   Temp 98.5 F (36.9 C)   Resp (!) 27   Ht 4\' 9"  (1.448 m)   Wt 49 kg   SpO2 98%   BMI 23.38 kg/m   Intake/Output from previous day: No intake/output data recorded. Intake/Output this shift: Total I/O In: 574.5 [IV Piggyback:574.5] Out: -    Physical Exam Vitals reviewed.  Constitutional:      Appearance: She is well-developed. She is ill-appearing.  Cardiovascular:     Rate and Rhythm: Normal rate and regular rhythm.     Heart sounds: Normal heart sounds.  Pulmonary:     Comments: Tachypnic Abdominal:     General: Abdomen is flat.     Palpations: Abdomen is soft.     Tenderness: There is abdominal tenderness in the right upper quadrant. There is right CVA tenderness.  Skin:    General: Skin is warm and dry.  Neurological:     General: No focal deficit present.     Mental Status: She is alert.    Lab Results:  Results for orders placed or performed during the hospital encounter of 01/20/21 (from the past 24 hour(s))  CBC with Differential     Status: Abnormal   Collection Time: 01/20/21  1:57 AM  Result Value Ref Range   WBC 10.3 4.0 - 10.5 K/uL   RBC 3.86 (L) 3.87 - 5.11 MIL/uL   Hemoglobin 12.2 12.0 - 15.0 g/dL   HCT 01/22/21 01/22/21 - 71.0 %   MCV 94.6 80.0 - 100.0 fL   MCH 31.6 26.0 - 34.0 pg   MCHC 33.4 30.0 - 36.0 g/dL   RDW 62.6 94.8 - 54.6 %   Platelets 228 150 - 400 K/uL   nRBC 0.0 0.0 - 0.2 %   Neutrophils Relative % 90 %   Neutro Abs 9.2 (H) 1.7 - 7.7 K/uL   Lymphocytes Relative 6 %   Lymphs Abs 0.6 (L) 0.7 - 4.0 K/uL   Monocytes Relative 4 %   Monocytes Absolute 0.4 0.1 - 1.0 K/uL   Eosinophils Relative 0 %   Eosinophils Absolute 0.0 0.0 -  0.5 K/uL   Basophils Relative 0 %    Basophils Absolute 0.0 0.0 - 0.1 K/uL   Immature Granulocytes 0 %   Abs Immature Granulocytes 0.03 0.00 - 0.07 K/uL  Comprehensive metabolic panel     Status: Abnormal   Collection Time: 01/20/21  1:57 AM  Result Value Ref Range   Sodium 135 135 - 145 mmol/L   Potassium 3.4 (L) 3.5 - 5.1 mmol/L   Chloride 103 98 - 111 mmol/L   CO2 25 22 - 32 mmol/L   Glucose, Bld 160 (H) 70 - 99 mg/dL   BUN 20 8 - 23 mg/dL   Creatinine, Ser 4.85 0.44 - 1.00 mg/dL   Calcium 8.8 (L) 8.9 - 10.3 mg/dL   Total Protein 7.7 6.5 - 8.1 g/dL   Albumin 4.2 3.5 - 5.0 g/dL   AST 21 15 - 41 U/L   ALT 15 0 - 44 U/L   Alkaline Phosphatase 64 38 - 126 U/L   Total Bilirubin 0.5 0.3 - 1.2 mg/dL   GFR, Estimated >46 >27 mL/min   Anion gap 7 5 - 15  Lipase, blood     Status: None   Collection Time: 01/20/21  1:57 AM  Result Value Ref Range   Lipase 43 11 - 51 U/L  Hemoglobin A1c     Status: None   Collection Time: 01/20/21  2:23 AM  Result Value Ref Range   Hgb A1c MFr Bld 5.4 4.8 - 5.6 %   Mean Plasma Glucose 108.28 mg/dL  Urinalysis, Routine w reflex microscopic Urine, Clean Catch     Status: Abnormal   Collection Time: 01/20/21  4:39 AM  Result Value Ref Range   Color, Urine STRAW (A) YELLOW   APPearance CLEAR CLEAR   Specific Gravity, Urine 1.040 (H) 1.005 - 1.030   pH 6.0 5.0 - 8.0   Glucose, UA 50 (A) NEGATIVE mg/dL   Hgb urine dipstick SMALL (A) NEGATIVE   Bilirubin Urine NEGATIVE NEGATIVE   Ketones, ur 20 (A) NEGATIVE mg/dL   Protein, ur NEGATIVE NEGATIVE mg/dL   Nitrite NEGATIVE NEGATIVE   Leukocytes,Ua NEGATIVE NEGATIVE   RBC / HPF 6-10 0 - 5 RBC/hpf   WBC, UA 0-5 0 - 5 WBC/hpf   Bacteria, UA NONE SEEN NONE SEEN   Squamous Epithelial / LPF 0-5 0 - 5   Mucus PRESENT   Resp Panel by RT-PCR (Flu A&B, Covid) Nasopharyngeal Swab     Status: None   Collection Time: 01/20/21  5:15 AM   Specimen: Nasopharyngeal Swab; Nasopharyngeal(NP) swabs in vial transport medium  Result Value Ref Range    SARS Coronavirus 2 by RT PCR NEGATIVE NEGATIVE   Influenza A by PCR NEGATIVE NEGATIVE   Influenza B by PCR NEGATIVE NEGATIVE  Lactic acid, plasma     Status: None   Collection Time: 01/20/21  8:10 AM  Result Value Ref Range   Lactic Acid, Venous 1.9 0.5 - 1.9 mmol/L  Magnesium     Status: None   Collection Time: 01/20/21  8:10 AM  Result Value Ref Range   Magnesium 2.2 1.7 - 2.4 mg/dL  D-dimer, quantitative     Status: Abnormal   Collection Time: 01/20/21  8:10 AM  Result Value Ref Range   D-Dimer, Quant 9.33 (H) 0.00 - 0.50 ug/mL-FEU  MRSA Next Gen by PCR, Nasal     Status: None   Collection Time: 01/20/21  2:25 PM   Specimen: Nasal Mucosa; Nasal Swab  Result Value  Ref Range   MRSA by PCR Next Gen NOT DETECTED NOT DETECTED  Lactic acid, plasma     Status: None   Collection Time: 01/20/21  2:25 PM  Result Value Ref Range   Lactic Acid, Venous 1.9 0.5 - 1.9 mmol/L  Brain natriuretic peptide     Status: Abnormal   Collection Time: 01/20/21  3:16 PM  Result Value Ref Range   B Natriuretic Peptide 718.5 (H) 0.0 - 100.0 pg/mL  Troponin I (High Sensitivity)     Status: Abnormal   Collection Time: 01/20/21  3:16 PM  Result Value Ref Range   Troponin I (High Sensitivity) 327 (HH) <18 ng/L    BMET Recent Labs    01/20/21 0157  NA 135  K 3.4*  CL 103  CO2 25  GLUCOSE 160*  BUN 20  CREATININE 0.89  CALCIUM 8.8*   PT/INR No results for input(s): LABPROT, INR in the last 72 hours. ABG No results for input(s): PHART, HCO3 in the last 72 hours.  Invalid input(s): PCO2, PO2  Studies/Results: CT ABDOMEN PELVIS W CONTRAST  Result Date: 01/20/2021 CLINICAL DATA:  Abdominal pain, nausea, bowel obstruction suspected EXAM: CT ABDOMEN AND PELVIS WITH CONTRAST TECHNIQUE: Multidetector CT imaging of the abdomen and pelvis was performed using the standard protocol following bolus administration of intravenous contrast. CONTRAST:  15mL OMNIPAQUE IOHEXOL 350 MG/ML SOLN COMPARISON:  None.  FINDINGS: Lower chest: Lung bases are clear. Hepatobiliary: Liver is within normal limits. Gallbladder is unremarkable. No intrahepatic or extrahepatic ductal dilatation Pancreas: Within normal limits. Spleen: Within normal limits. Adrenals/Urinary Tract: Adrenal glands are within normal limits. Left kidney is within normal limits. Right kidney is notable for perirenal edema with perinephric fluid and mild right hydroureteronephrosis. Associated 2 mm distal right ureteral calculus at the UVJ (series 2/image 69). Bladder is within normal limits. Stomach/Bowel: Stomach is within normal limits. No evidence of bowel obstruction. Normal appendix (series 2/image 58). No colonic wall thickening or inflammatory changes. Vascular/Lymphatic: No evidence of abdominal aortic aneurysm. Atherosclerotic calcifications of the abdominal aorta and branch vessels. No suspicious abdominopelvic lymphadenopathy. Reproductive: Uterus is within normal limits. Bilateral ovaries are within normal limits. Other: No abdominopelvic ascites. Musculoskeletal: Mild degenerative changes of the lower lumbar spine. IMPRESSION: 2 mm distal right ureteral calculus at the UVJ. Associated mild right hydroureteronephrosis. No evidence of bowel obstruction. Electronically Signed   By: Charline Bills M.D.   On: 01/20/2021 03:54   US RENAL  Result Date: 01/20/2021 CLINICAL DATA:  Hydronephrosis. EXAM: RENAL / URINARY TRACT ULTRASOUND COMPLETE COMPARISON:  CT abdomen pelvis 01/20/2021. FINDINGS: Right Kidney: Renal measurements: 10.1 x 4.9 x 5.3 cm = volume: 139 mL. Echogenicity within normal limits. No mass. Mild hydronephrosis visualized. Left Kidney: Renal measurements: 10.3 x 5.5 x 4.9 cm = volume: 143 mL. Echogenicity within normal limits. No mass or hydronephrosis visualized. Urinary bladder: Appears normal for degree of bladder distention. Other: None. IMPRESSION: Mild right hydronephrosis. Electronically Signed   By: Tish Frederickson M.D.   On:  01/20/2021 15:56   DG Chest Port 1 View  Result Date: 01/20/2021 CLINICAL DATA:  Fever EXAM: PORTABLE CHEST 1 VIEW COMPARISON:  11/01/2019 FINDINGS: Cardiac size is unremarkable. Central pulmonary vessels are prominent. There is poor inspiration. Interstitial markings in the parahilar regions and lower lung fields are prominent. There is no focal consolidation. There is no pleural effusion or pneumothorax. IMPRESSION: Central pulmonary vessels are prominent without signs of alveolar pulmonary edema. There is prominence of interstitial markings in the  parahilar regions and lower lung fields suggesting bronchitis or interstitial pneumonitis. Electronically Signed   By: Ernie Avena M.D.   On: 01/20/2021 08:39    I have discussed her case with Dr. Marchelle Gearing and reviewed the pertinent labs and imaging.     Assessment/Plan: 57mm right distal ureteral stone with obstruction and sepsis.    We will proceed with cystoscopy and insertion of a right ureteral stent this evening.  I have reviewed the risks of bleeding, infection, ureteral injury,  need for secondary procedures, thrombotic events and anesthetic complications.          No follow-ups on file.    CC: Dr. Kalman Shan.      Bjorn Pippin 01/20/2021 [phone]

## 2021-01-20 NOTE — Progress Notes (Signed)
ABG and send down to lab. Lab called .

## 2021-01-20 NOTE — ED Notes (Signed)
Pt. Was stating at 92% RA placed on 3L/min via [place] O2. Stats increased to 96%. MD notified.

## 2021-01-20 NOTE — ED Triage Notes (Signed)
Pt complains of abdominal pain since 9 pm. Pt states that she is also nauseous.

## 2021-01-20 NOTE — ED Provider Notes (Signed)
Received patient in checkout from Dr. Eudelia Bunch.  Patient with persistent fever of 102.7.  Persistent tachycardia and tachypnea.  At 1 point nursing staff reported patient's oxygen saturation was 88% on room air.  She was placed on 2 L and now is in the high 90 percentile.  She denies any cough or shortness of breath.  Her husband reported on Saturday she did have a mild headache but did not have fever at that time.  The fever has not started until today.  COVID, flu are negative, lactic acid is within normal limits, patient does have a renal stone and is still having some flank pain but urine without signs of infection.  She has no neck pain or headache at this time.  She is not altered and low suspicion for meningitis.  Patient given antibiotics for fever of unknown origin and early sepsis.  Blood cultures and urine cultures are pending.  We will plan on admission.  She has received 1.5 L of fluid and was started on a rate.  Blood pressure remained stable.   Gwyneth Sprout, MD 01/20/21 [phone]

## 2021-01-20 NOTE — ED Provider Notes (Signed)
[facility]-EMERGENCY DEPT Provider Note  CSN: [id number] Arrival date & time: 01/20/21 0057  Chief Complaint(s) Abdominal Pain  HPI Deborah Glass is a 70 y.o. female    Abdominal Pain Pain location:  Epigastric and R flank Pain quality: aching and stabbing   Pain radiates to:  Does not radiate Pain severity:  Severe Onset quality:  Sudden Duration:  5 hours Timing:  Constant Progression:  Worsening Chronicity:  New Context: not diet changes, not eating, not previous surgeries, not recent illness, not sick contacts and not suspicious food intake   Relieved by:  Nothing Worsened by:  Movement and palpation Ineffective treatments:  OTC medications Associated symptoms: constipation, nausea and vomiting   Associated symptoms: no belching, no chest pain, no chills, no diarrhea, no fever, no flatus and no shortness of breath    Past Medical History History reviewed. No pertinent past medical history. Patient Active Problem List   Diagnosis Date Noted   Psychophysiological insomnia 12/09/2020   Hyperlipidemia with target LDL less than 130 12/09/2020   Encounter for general adult medical examination with abnormal findings 12/09/2020   Visit for screening mammogram 12/09/2020   Asthma 07/04/2016   Esophageal stricture 03/06/2013   Vitamin D deficiency 03/26/2012   Home Medication(s) Prior to Admission medications   Medication Sig Start Date End Date Taking? Authorizing Provider  cholecalciferol (VITAMIN D3) 25 MCG (1000 UNIT) tablet Take 1 tablet (1,000 Units total) by mouth daily. 01/11/20  Yes Just, Azalee Course, FNP  Suvorexant (BELSOMRA) 10 MG TABS Take 10 mg by mouth at bedtime as needed. 12/10/20  Yes Etta Grandchild, MD                                                                                                                                    Past Surgical History Past Surgical History:  Procedure Laterality Date   BREAST EXCISIONAL BIOPSY Right     Family History Family History  Problem Relation Age of Onset   Stomach cancer Father     Social History Social History   Tobacco Use   Smoking status: Never   Smokeless tobacco: Never  Substance Use Topics   Alcohol use: No   Drug use: No   Allergies Patient has no known allergies.  Review of Systems Review of Systems  Constitutional:  Negative for chills and fever.  Respiratory:  Negative for shortness of breath.   Cardiovascular:  Negative for chest pain.  Gastrointestinal:  Positive for abdominal pain, constipation, nausea and vomiting. Negative for diarrhea and flatus.  All other systems are reviewed and are negative for acute change except as noted in the HPI  Physical Exam Vital Signs  I have reviewed the triage vital signs BP (!) 178/87   Pulse 65   Temp 98.2 F (36.8 C) (Oral)   Resp 15   SpO2 100%   Physical Exam Vitals reviewed.  Constitutional:  General: She is not in acute distress.    Appearance: She is well-developed. She is not diaphoretic.  HENT:     Head: Normocephalic and atraumatic.     Right Ear: External ear normal.     Left Ear: External ear normal.     Nose: Nose normal.  Eyes:     General: No scleral icterus.    Conjunctiva/sclera: Conjunctivae normal.  Neck:     Trachea: Phonation normal.  Cardiovascular:     Rate and Rhythm: Normal rate and regular rhythm.  Pulmonary:     Effort: Pulmonary effort is normal. No respiratory distress.     Breath sounds: No stridor.  Abdominal:     General: There is no distension.     Tenderness: There is abdominal tenderness in the right upper quadrant, right lower quadrant, epigastric area and periumbilical area. There is right CVA tenderness. There is no guarding or rebound. Negative signs include Murphy's sign and McBurney's sign.  Musculoskeletal:        General: Normal range of motion.     Cervical back: Normal range of motion.  Neurological:     Mental Status: She is alert and  oriented to person, place, and time.  Psychiatric:        Behavior: Behavior normal.    ED Results and Treatments Labs (all labs ordered are listed, but only abnormal results are displayed) Labs Reviewed  CBC WITH DIFFERENTIAL/PLATELET - Abnormal; Notable for the following components:      Result Value   RBC 3.86 (*)    Neutro Abs 9.2 (*)    Lymphs Abs 0.6 (*)    All other components within normal limits  COMPREHENSIVE METABOLIC PANEL - Abnormal; Notable for the following components:   Potassium 3.4 (*)    Glucose, Bld 160 (*)    Calcium 8.8 (*)    All other components within normal limits  URINALYSIS, ROUTINE W REFLEX MICROSCOPIC - Abnormal; Notable for the following components:   Color, Urine STRAW (*)    Specific Gravity, Urine 1.040 (*)    Glucose, UA 50 (*)    Hgb urine dipstick SMALL (*)    Ketones, ur 20 (*)    All other components within normal limits  RESP PANEL BY RT-PCR (FLU A&B, COVID) ARPGX2  LIPASE, BLOOD                                                                                                                         EKG  EKG Interpretation  Date/Time:    Ventricular Rate:    PR Interval:    QRS Duration:   QT Interval:    QTC Calculation:   R Axis:     Text Interpretation:         Radiology CT ABDOMEN PELVIS W CONTRAST  Result Date: 01/20/2021 CLINICAL DATA:  Abdominal pain, nausea, bowel obstruction suspected EXAM: CT ABDOMEN AND PELVIS WITH CONTRAST TECHNIQUE: Multidetector CT imaging of the abdomen and pelvis was  performed using the standard protocol following bolus administration of intravenous contrast. CONTRAST:  65mL OMNIPAQUE IOHEXOL 350 MG/ML SOLN COMPARISON:  None. FINDINGS: Lower chest: Lung bases are clear. Hepatobiliary: Liver is within normal limits. Gallbladder is unremarkable. No intrahepatic or extrahepatic ductal dilatation Pancreas: Within normal limits. Spleen: Within normal limits. Adrenals/Urinary Tract: Adrenal glands are  within normal limits. Left kidney is within normal limits. Right kidney is notable for perirenal edema with perinephric fluid and mild right hydroureteronephrosis. Associated 2 mm distal right ureteral calculus at the UVJ (series 2/image 69). Bladder is within normal limits. Stomach/Bowel: Stomach is within normal limits. No evidence of bowel obstruction. Normal appendix (series 2/image 58). No colonic wall thickening or inflammatory changes. Vascular/Lymphatic: No evidence of abdominal aortic aneurysm. Atherosclerotic calcifications of the abdominal aorta and branch vessels. No suspicious abdominopelvic lymphadenopathy. Reproductive: Uterus is within normal limits. Bilateral ovaries are within normal limits. Other: No abdominopelvic ascites. Musculoskeletal: Mild degenerative changes of the lower lumbar spine. IMPRESSION: 2 mm distal right ureteral calculus at the UVJ. Associated mild right hydroureteronephrosis. No evidence of bowel obstruction. Electronically Signed   By: Charline Bills M.D.   On: 01/20/2021 03:54    Pertinent labs & imaging results that were available during my care of the patient were reviewed by me and considered in my medical decision making (see MDM for details).  Medications Ordered in ED Medications  sodium chloride 0.9 % bolus 500 mL (has no administration in time range)  ondansetron (ZOFRAN) injection 4 mg (4 mg Intravenous Given 01/20/21 0527)  sodium chloride 0.9 % bolus 1,000 mL (1,000 mLs Intravenous New Bag/Given 01/20/21 0527)  iohexol (OMNIPAQUE) 350 MG/ML injection 75 mL (75 mLs Intravenous Contrast Given 01/20/21 0332)  ketorolac (TORADOL) 15 MG/ML injection 7.5 mg (7.5 mg Intravenous Given 01/20/21 0527)  tamsulosin (FLOMAX) capsule 0.4 mg (0.4 mg Oral Given 01/20/21 0529)  acetaminophen (TYLENOL) tablet 1,000 mg (1,000 mg Oral Given 01/20/21 0529)                                                                                                                                      Procedures Procedures  (including critical care time)  Medical Decision Making / ED Course I have reviewed the nursing notes for this encounter and the patient's prior records (if available in EHR or on provided paperwork).  Reece Sakira Dahmer was evaluated in Emergency Department on 01/20/2021 for the symptoms described in the history of present illness. She was evaluated in the context of the global COVID-19 pandemic, which necessitated consideration that the patient might be at risk for infection with the SARS-CoV-2 virus that causes COVID-19. Institutional protocols and algorithms that pertain to the evaluation of patients at risk for COVID-19 are in a state of rapid change based on information released by regulatory bodies including the CDC and federal and state organizations. These policies and algorithms were followed during the patient's care in  the ED.  Clinical Course as of 01/20/21 1030  Mon Jan 20, 2021  0221 Sudden onset epigastric and right flank abdominal pain with nausea and nonbloody nonbilious emesis.  Patient also reports no bowel movement in the last 24 hours.  She is not passing gas either.  Will assess for bowel obstruction, pancreatitis, biliary disease, UTI/pyelonephritis, renal colic.  Screening labs obtain.  Plan for CT imaging. Patient given IV fluids and pain medicine.  Antiemetics given. [PC]  0609 Work up notable for 12mm right ureteral stone with hydronephrosis. UA not infected. No other cause of pain.  Patient developed a fever to 103.2 here. No other infectious symptoms. COVID/Flu obtained  [PC]  0644 Covid/flu negative.  No current pain or complaints. No rash or edema. Doubt contrast allergy No source of infection. Possible viral process  [PC]    Clinical Course User Index [PC] Genie Mirabal, Amadeo Garnet, MD     Pertinent labs & imaging results that were available during my care of the patient were reviewed by me and considered in my  medical decision making:    Final Clinical Impression(s) / ED Diagnoses Final diagnoses:  Sepsis without acute organ dysfunction, due to unspecified organism [facility])  Kidney stone     This chart was dictated using voice recognition software.  Despite best efforts to proofread,  errors can occur which can change the documentation meaning.    Nira Conn, MD 01/23/21 [phone]

## 2021-01-20 NOTE — Anesthesia Procedure Notes (Signed)
Arterial Line Insertion Start/End10/31/2022 7:22 AM, 01/20/2021 7:27 AM Performed by: Ezekiel Ina, CRNA, CRNA  Patient location: OR. Preanesthetic checklist: patient identified, IV checked, site marked, risks and benefits discussed, surgical consent, monitors and equipment checked, pre-op evaluation, timeout performed and anesthesia consent Lidocaine 1% used for infiltration Right, radial was placed Catheter size: 20 G Hand hygiene performed  and Seldinger technique used Allen's test indicative of satisfactory collateral circulation Attempts: 1 Procedure performed without using ultrasound guided technique. Following insertion, Biopatch and dressing applied. Post procedure assessment: normal and unchanged  Patient tolerated the procedure well with no immediate complications.

## 2021-01-20 NOTE — Op Note (Addendum)
PATIENT:  Deborah Glass  Preoperative diagnosis:  Obstructing right ureteral stone Sepsis   Postoperative diagnosis:  Same   Procedure: Cystoscopy Right ureteral stent placement (6Fr x24cm)  Right retrograde pyelography with interpretation  Application of fluoroscopy.   Surgeon: Bjorn Pippin, M.D.  Anesthesia: General  Complications: None  EBL: Minimal  Specimens: None  Indication: [name] is a 70 y.o. female with 85mm right distal obstructing stone and concern for sepsis. She presents for emergent stent placement. After reviewing the management options for treatment, they have elected to proceed with the above surgical procedure(s). We have discussed the potential benefits and risks of the procedure, side effects of the proposed treatment, the likelihood of the patient achieving the goals of the procedure, and any potential problems that might occur during the procedure or recuperation. Informed consent has been obtained.  Findings:  -~38mm smooth stone within bladder, sent of analysis  -Cloudy urine with debris from right ureter upon wire placement  -J-hooking of proximal ureter  -Successful placement of 6Fr x 24 cm stent without dangler   Description of procedure:   The patient was taken to the operating room and general anesthesia was induced.  The patient was placed in the dorsal lithotomy position, prepped and draped in the usual sterile fashion, and preoperative antibiotics were administered. A preoperative time-out was performed.   Cystourethroscopy was performed.  The patient's urethra was examined and was normal. The bladder was then systematically examined in its entirety. We identified a small stone within the bladder and irrigated this out through the scope and sent the stone for analysis.   Attention then turned to the right ureteral orifice and a ureteral catheter was used to intubate the ureteral orifice.  Omnipaque contrast was injected through the  ureteral catheter and a retrograde pyelogram which revealed the above findings.  A Sensor guidewire was then advanced up the right ureter into the renal pelvis under fluoroscopic guidance.  We attempted to straighten the ureter by advancing the open ended catheter over the wire and replacing the sensor with a glide wire. We then replaced the sensor wire for our stent placement. We passed a 6Fr x 24cm stent over the wire into the upper pole under direct vision and with fluoroscopic guidance.  The wire was then removed with an adequate stent curl noted in the renal pelvis as well as in the bladder.  The bladder was left full and a foley catheter was placed for bladder decompression.  Attestation: Dr. Bjorn Pippin was present for the entire procedure.

## 2021-01-20 NOTE — Anesthesia Preprocedure Evaluation (Addendum)
Anesthesia Evaluation  Patient identified by MRN, date of birth, ID band Patient awake    Reviewed: Allergy & Precautions, NPO status , Patient's Chart, lab work & pertinent test results  Airway Mallampati: III  TM Distance: >3 FB Neck ROM: Full    Dental no notable dental hx. (+) Edentulous Upper, Edentulous Lower   Pulmonary shortness of breath and at rest, asthma ,  B/L pulm infiltrates- poss pulm edema, BNP>700   CXR this AM: IMPRESSION: Central pulmonary vessels are prominent without signs of alveolar pulmonary edema. There is prominence of interstitial markings in the parahilar regions and lower lung fields suggesting bronchitis or interstitial pneumonitis.    + decreased breath sounds      Cardiovascular Normal cardiovascular exam Rhythm:Regular Rate:Normal  Elevated trops thought to be 2/2 sepsis, but normal lactic  EKG w/ RBBB (no priors for comparison) Echo ordered but not completed   Neuro/Psych negative neurological ROS  negative psych ROS   GI/Hepatic negative GI ROS, Neg liver ROS,   Endo/Other  negative endocrine ROS  Renal/GU Renal diseaseRight ureteral calculus w/ hydronephrosis  Normal Cr  negative genitourinary   Musculoskeletal negative musculoskeletal ROS (+)   Abdominal   Peds  Hematology negative hematology ROS (+) hct 36.5   Anesthesia Other Findings Severe urosepsis on pressors  Reproductive/Obstetrics negative OB ROS                            Anesthesia Physical Anesthesia Plan  ASA: 4  Anesthesia Plan: General   Post-op Pain Management:    Induction: Intravenous  PONV Risk Score and Plan: 4 or greater and Ondansetron, Dexamethasone and Treatment may vary due to age or medical condition  Airway Management Planned: Oral ETT  Additional Equipment:   Intra-op Plan:   Post-operative Plan: Extubation in OR and Possible Post-op  intubation/ventilation  Informed Consent: I have reviewed the patients History and Physical, chart, labs and discussed the procedure including the risks, benefits and alternatives for the proposed anesthesia with the patient or authorized representative who has indicated his/her understanding and acceptance.     Dental advisory given and Interpreter used for interveiw  Plan Discussed with: CRNA  Anesthesia Plan Comments: (Levophed @6mcg /min going peripherally  From ICU with multiple small bore peripheral IVs, on 4LPM facemask for transport to OR (on BiPAP in ICU) Will place central line and arterial line Limit fluids given elevated BNP, pulmonary edema  D/w family and pt possibility of returning to ICU with ETT/vent)       Anesthesia Quick Evaluation

## 2021-01-20 NOTE — Sepsis Progress Note (Signed)
[facility] is following this sepsis 

## 2021-01-20 NOTE — Progress Notes (Addendum)
[facility]Link Physician-Brief Progress Note Patient Name: Deborah Glass DOB: [birth year] MRN: [id number]   Date of Service  01/20/2021  HPI/Events of Note  Patient in ICU now post OR Is on BIPAP No distress on camera Per notes, CVC was placed in OR   eICU Interventions  CXR  BMP, lactate and ABG ordered Troponin ordered by day team is also to be sent Call us with results      Intervention Category Major Interventions: Respiratory failure - evaluation and management  Abrahim Sargent G Gerell Fortson 01/20/2021, 9:59 PM  Addendum at 11:20 pm  CXR with central line in good position BMP noted, mild hyperchloremic acidosis K better - will give another 20 meq IV kcl LA normal Troponin down trending Growing E coli in blood, antibiotics modified   Addendum at 6:15 am Glucose slowly dropping , 76 and is npo Add D5ns at 50 cc/hour

## 2021-01-20 NOTE — Progress Notes (Signed)
PHARMACY - PHYSICIAN COMMUNICATION CRITICAL VALUE ALERT - BLOOD CULTURE IDENTIFICATION (BCID)  Deborah Glass is an 70 y.o. female who presented to [facility] on 01/20/2021 with a chief complaint of abdominal pain.  Assessment:    Patient found to have ureteral stone with obstruction and sepsis.  Right ureteral stent placed tonight.   She is now afebrile on broad-spectrum antibiotics but remains on pressors & BiPap. Blood cx + Ecoli, no resistance.  Name of physician (or Provider) Contacted: Kamat  Current antibiotics: Cefepime + Vancomycin  Changes to prescribed antibiotics recommended:  D/C Vancomycin Change Cefepime to Rocephin 2gm IV q24h Provider accepted recommendations.   Results for orders placed or performed during the hospital encounter of 01/20/21  Blood Culture ID Panel (Reflexed) (Collected: 01/20/2021  8:19 AM)  Result Value Ref Range   Enterococcus faecalis NOT DETECTED NOT DETECTED   Enterococcus Faecium NOT DETECTED NOT DETECTED   Listeria monocytogenes NOT DETECTED NOT DETECTED   Staphylococcus species NOT DETECTED NOT DETECTED   Staphylococcus aureus (BCID) NOT DETECTED NOT DETECTED   Staphylococcus epidermidis NOT DETECTED NOT DETECTED   Staphylococcus lugdunensis NOT DETECTED NOT DETECTED   Streptococcus species NOT DETECTED NOT DETECTED   Streptococcus agalactiae NOT DETECTED NOT DETECTED   Streptococcus pneumoniae NOT DETECTED NOT DETECTED   Streptococcus pyogenes NOT DETECTED NOT DETECTED   A.calcoaceticus-baumannii NOT DETECTED NOT DETECTED   Bacteroides fragilis NOT DETECTED NOT DETECTED   Enterobacterales DETECTED (A) NOT DETECTED   Enterobacter cloacae complex NOT DETECTED NOT DETECTED   Escherichia coli DETECTED (A) NOT DETECTED   Klebsiella aerogenes NOT DETECTED NOT DETECTED   Klebsiella oxytoca NOT DETECTED NOT DETECTED   Klebsiella pneumoniae NOT DETECTED NOT DETECTED   Proteus species NOT DETECTED NOT DETECTED   Salmonella species NOT  DETECTED NOT DETECTED   Serratia marcescens NOT DETECTED NOT DETECTED   Haemophilus influenzae NOT DETECTED NOT DETECTED   Neisseria meningitidis NOT DETECTED NOT DETECTED   Pseudomonas aeruginosa NOT DETECTED NOT DETECTED   Stenotrophomonas maltophilia NOT DETECTED NOT DETECTED   Candida albicans NOT DETECTED NOT DETECTED   Candida auris NOT DETECTED NOT DETECTED   Candida glabrata NOT DETECTED NOT DETECTED   Candida krusei NOT DETECTED NOT DETECTED   Candida parapsilosis NOT DETECTED NOT DETECTED   Candida tropicalis NOT DETECTED NOT DETECTED   Cryptococcus neoformans/gattii NOT DETECTED NOT DETECTED   CTX-M ESBL NOT DETECTED NOT DETECTED   Carbapenem resistance IMP NOT DETECTED NOT DETECTED   Carbapenem resistance KPC NOT DETECTED NOT DETECTED   Carbapenem resistance NDM NOT DETECTED NOT DETECTED   Carbapenem resist OXA 48 LIKE NOT DETECTED NOT DETECTED   Carbapenem resistance VIM NOT DETECTED NOT DETECTED    Junita Push PharmD 01/20/2021  10:52 PM

## 2021-01-20 NOTE — Progress Notes (Signed)
Pharmacy Antibiotic Note  Deborah Glass is a 70 y.o. female admitted on 01/20/2021 with fever, renal stone, r/o sepsis.  Pharmacy has been consulted for Cefepime & Vancomycin dosing.  Plan: Cefepime 2gm q12 Vanc 1gm x1, then 500mg  q24  Height: 4\' 9"  (144.8 cm) Weight: 49 kg (108 lb 0.4 oz) IBW/kg (Calculated) : 38.6  Temp (24hrs), Avg:101 F (38.3 C), Min:98.2 F (36.8 C), Max:102.7 F (39.3 C)  Recent Labs  Lab 01/20/21 0157 01/20/21 0810  WBC 10.3  --   CREATININE 0.89  --   LATICACIDVEN  --  1.9    Estimated Creatinine Clearance: 38.6 mL/min (by C-G formula based on SCr of 0.89 mg/dL).    No Known Allergies  Antimicrobials this admission: 10/31 Flagyl x1 10/31 Cefepime >>  10/31 Vancomycin >>  Dose adjustments this admission:  Microbiology results: 10/31 BCx: sent 10/31 UCx: sent   MRSA PCR ordered  Thank you for allowing pharmacy to be a part of this patient's care.  11/31 PharmD 01/20/2021 10:16 AM

## 2021-01-20 NOTE — Consult Note (Addendum)
NAME:  Deborah Glass, MRN:  [id number], DOB:  08-01-1950, LOS: 0 ADMISSION DATE:  01/20/2021, CONSULTATION DATE:  10/31 REFERRING MD: Ronaldo Miyamoto , CHIEF COMPLAINT:  septic shock    History of Present Illness:  This is a 70 year old female with English as a second language  presented to the [facility] emergency room on 10/31 with chief complaint of right flank pain, abdomen pain, nausea, vomiting, and fever.  Pain onset about 1 to 2 days, no sick exposures.  No dysuria but reported some frequency.  Initially presented tachycardic with heart rate 112 and mildly tachypneic.  CT abdomen obtained showed 2 mm distal right ureteral calculus at the UVJ, and mild associated right hydroureteronephrosis.  She was started on IV hydration, cultures were sent, and empiric antibiotics initiated.  After about 6 hours into her stay in the emergency room in spite of receiving 30 mL/kg predicted body weight IV fluid she became progressively hypotensive requiring initiation of norepinephrine peripherally.  Because of clinical decline critical care asked to evaluate.  Pertinent  Medical History  Hyperlipidemia, vitamin D deficiency. Significant Hospital Events: Including procedures, antibiotic start and stop dates in addition to other pertinent events   10/31 admitted with right flank pain.  CT abdomen with 2 mm distal right ureteral calculus and hydroureteronephrosis.  Cultures were sent, cefepime and vancomycin initiated, received 30 mL/kg IV fluid bolus in the emergency room.  Approximately 6 hours into her ER stay developed progressive hypotension Requiring addition of norepinephrine.  Was placed on supplemental oxygen in the ER however room air sats were 93%.  Additional data reporting critical troponin at 327, elevated BNP greater than 700, and mild interstitial changes on chest x-ray.  Interim History / Subjective:  Still feels very weak on   Objective   Blood pressure 94/66, pulse 96, temperature 97.9 F (36.6  C), temperature source Oral, resp. rate (Abnormal) 25, height 4\' 9"  (1.448 m), weight 49 kg, SpO2 95 %.        Intake/Output Summary (Last 24 hours) at 01/20/2021 1613 Last data filed at 01/20/2021 1112 Gross per 24 hour  Intake 200 ml  Output no documentation  Net 200 ml   Filed Weights   01/20/21 0815  Weight: 49 kg    Examination: General: Acutely ill-appearing 70 year old female lying in stretcher not currently in acute distress HEENT: Normocephalic atraumatic mild jugular venous distention Lungs: Fine crackles bases no accessory use currently 3 L/min Cardiovascular: Regular rate and rhythm Abdomen: Soft not tender Extremities: Warm dry Neuro: Awake and oriented able to speak full sentences no focal deficits GU: Voids  Resolved Hospital Problem list     Assessment & Plan:  Severe sepsis/septic shock (POA) secondary to urinary tract source w/ associated right hydroureteronephrosis and noted right uretearl calculus  -Has received 43ml/kg  -now on pressors Plan Admit to ICU  Cultures sent Maintain euvolemia Central access on arrival to ICU-->CVP monitoring  Repeat lacate F/u renal 31m showing mild hydro. Will ask for urology opinion. ? Perc drainage?  IV vanc and cefepime   Dyspnea w/ bilateral pulmonary infiltrates (POA) DDx ALI in context of sepsis. Pulmonary edema. Had h/o childhood asthma. Says this feels "heavy and different" has progressed w/ fluid challenge. BNP > 700 argues at least some of this being edema  Plan KVO IVFs No lasix for now BIPAP PRN Pulse ox   Abnormal troponin and BNP w/ age undetermined BBB Likely r/t sepsis but can't r/o CM. She denies chest pain.  Plan Stat  echo Cont to cycle CEs; if cont to rise get cards consult  Hold off of heparin & ASA for now pending possible procedure   Fluid and Electrolyte imbalance: hypokalemia Plan Replace K   Hyperglycemia Plan Glucose goal 140-180 Ssi   Best Practice (right click and "Reselect  all SmartList Selections" daily)   Diet/type: NPO DVT prophylaxis: prophylactic heparin  GI prophylaxis: N/A Lines: Central line Foley:  N/A Code Status:  full code Last date of multidisciplinary goals of care discussion [pending ]  Labs   CBC: Recent Labs  Lab 01/20/21 0157  WBC 10.3  NEUTROABS 9.2*  HGB 12.2  HCT 36.5  MCV 94.6  PLT 228    Basic Metabolic Panel: Recent Labs  Lab 01/20/21 0157 01/20/21 0810  NA 135  --   K 3.4*  --   CL 103  --   CO2 25  --   GLUCOSE 160*  --   BUN 20  --   CREATININE 0.89  --   CALCIUM 8.8*  --   MG  --  2.2   GFR: Estimated Creatinine Clearance: 38.6 mL/min (by C-G formula based on SCr of 0.89 mg/dL). Recent Labs  Lab 01/20/21 0157 01/20/21 0810 01/20/21 1425  WBC 10.3  --   --   LATICACIDVEN  --  1.9 1.9    Liver Function Tests: Recent Labs  Lab 01/20/21 0157  AST 21  ALT 15  ALKPHOS 64  BILITOT 0.5  PROT 7.7  ALBUMIN 4.2   Recent Labs  Lab 01/20/21 0157  LIPASE 43   No results for input(s): AMMONIA in the last 168 hours.  ABG No results found for: PHART, PCO2ART, PO2ART, HCO3, TCO2, ACIDBASEDEF, O2SAT   Coagulation Profile: No results for input(s): INR, PROTIME in the last 168 hours.  Cardiac Enzymes: No results for input(s): CKTOTAL, CKMB, CKMBINDEX, TROPONINI in the last 168 hours.  HbA1C: Hgb A1c MFr Bld  Date/Time Value Ref Range Status  01/20/2021 02:23 AM 5.4 4.8 - 5.6 % Final    Comment:    (NOTE) Pre diabetes:          5.7%-6.4%  Diabetes:              >6.4%  Glycemic control for   <7.0% adults with diabetes   01/10/2020 03:20 PM 5.4 4.8 - 5.6 % Final    Comment:             Prediabetes: 5.7 - 6.4          Diabetes: >6.4          Glycemic control for adults with diabetes: <7.0     CBG: No results for input(s): GLUCAP in the last 168 hours.  Review of Systems:   Review of Systems  Constitutional:  Positive for fever and malaise/fatigue.  HENT: Negative.    Eyes:  Negative.   Respiratory:  Positive for shortness of breath.   Cardiovascular:  Negative for chest pain and palpitations.  Gastrointestinal:  Positive for heartburn, nausea and vomiting.  Genitourinary:  Positive for flank pain and frequency.  Musculoskeletal:  Positive for myalgias.  Neurological: Negative.   Endo/Heme/Allergies: Negative.   Psychiatric/Behavioral: Negative.      Past Medical History:  She,  has no past medical history on file.   Surgical History:   Past Surgical History:  Procedure Laterality Date   BREAST EXCISIONAL BIOPSY Right      Social History:   reports that she has never smoked. She has  never used smokeless tobacco. She reports that she does not drink alcohol and does not use drugs.   Family History:  Her family history includes Stomach cancer in her father.   Allergies No Known Allergies   Home Medications  Prior to Admission medications   Medication Sig Start Date End Date Taking? Authorizing Provider  cholecalciferol (VITAMIN D3) 25 MCG (1000 UNIT) tablet Take 1 tablet (1,000 Units total) by mouth daily. 01/11/20  Yes Just, Azalee Course, FNP  Suvorexant (BELSOMRA) 10 MG TABS Take 10 mg by mouth at bedtime as needed. 12/10/20  Yes Etta Grandchild, MD     Critical care time: 45 minutes   Simonne Martinet ACNP-BC [facility] Pager # [phone] OR # [phone] if no answer

## 2021-01-20 NOTE — Transfer of Care (Signed)
Immediate Anesthesia Transfer of Care Note  Patient: Deborah Glass  Procedure(s) Performed: CYSTOSCOPY WITH STENT REPLACEMENT, RETROGRADE (Right)  Patient Location: PACU  Anesthesia Type:General  Level of Consciousness: drowsy  Airway & Oxygen Therapy: Patient Spontanous Breathing and Patient connected to face mask  Post-op Assessment: Report given to RN and Post -op Vital signs reviewed and stable  Post vital signs: Reviewed and stable  Last Vitals:  Vitals Value Taken Time  BP 96/64 01/20/21 2020  Temp    Pulse 78 01/20/21 2026  Resp 25 01/20/21 2026  SpO2 100 % 01/20/21 2026  Vitals shown include unvalidated device data.  Last Pain:  Vitals:   01/20/21 1117  TempSrc: Oral  PainSc:          Complications: No notable events documented.

## 2021-01-20 NOTE — Progress Notes (Signed)
Pt is in surgery not back yet.

## 2021-01-20 NOTE — ED Notes (Addendum)
Critical lab value: Trop 327  Notified Tyrone Kyle DO at this time. 1603

## 2021-01-20 NOTE — Progress Notes (Signed)
I removed her gold cross necklace and gave it tho her husband  Simonne Martinet ACNP-BC [facility] Pulmonary/Critical Care Pager # [phone] OR # [phone] if no answer

## 2021-01-20 NOTE — Progress Notes (Signed)
A consult was received from an ED physician for vancomycin and cefepime per pharmacy dosing.  The patient's profile has been reviewed for ht/wt/allergies/indication/available labs.    CC: abdominal pain. Tmax of 102.7, WBC ok.   A one time order has been placed for cefepime 2g IV, vancomycin 1g IV, and metronidazole 500mg  IV. Further antibiotics/pharmacy consults should be ordered by admitting physician if indicated.        , PharmD, BCPS, BCIDP Clinical Pharmacist 01/20/2021 8:33 AM

## 2021-01-20 NOTE — Anesthesia Procedure Notes (Signed)
Central Venous Catheter Insertion Performed by: Lannie Fields, DO, anesthesiologist Start/End10/31/2022 7:30 PM, 01/20/2021 7:40 PM Patient location: OR. Preanesthetic checklist: patient identified, IV checked, site marked, risks and benefits discussed, surgical consent, monitors and equipment checked, pre-op evaluation, timeout performed and anesthesia consent Lidocaine 1% used for infiltration and patient sedated Hand hygiene performed  and maximum sterile barriers used  Catheter size: 8 Fr Total catheter length 16. Central line was placed.Double lumen Procedure performed using ultrasound guided technique. Ultrasound Notes:anatomy identified, needle tip was noted to be adjacent to the nerve/plexus identified, no ultrasound evidence of intravascular and/or intraneural injection and image(s) printed for medical record Attempts: 1 Following insertion, dressing applied and line sutured. Post procedure assessment: blood return through all ports  Patient tolerated the procedure well with no immediate complications.

## 2021-01-20 NOTE — H&P (Addendum)
History and Physical    Deborah Glass BDZ:[id number] DOB: 06/16/1950 DOA: 01/20/2021  PCP: Etta Grandchild, MD  Patient coming from: Home  Chief Complaint: abdominal pain  HPI: Deborah Glass is a 70 y.o. female with medical history significant of vit d deficiency, HLD. Presenting with abdominal pain, fever. Symptoms started last night. Abdominal pain was mostly in the RLQ w/ radiation to right flank. She only characterizes her pain as "terrible". She tried some OTC meds to help, but nothing helps. She did not have any N/V/D. She had not sick contacts. She denies any dysuria. She became concerned and came to the ED for help. She denies any other aggravating or alleviating factors.  Of note, during interview, she seems a little confused. Attempted call to husband to confirm story, but got VM only.   ED Course: CT ab/pelvis showed: 2 mm distal right ureteral calculus at the UVJ. Associated mild right hydroureteronephrosis. She was given fluids, pain control. UA was negative. She became hypoxic on RA and showed persistent tachy, tachypnea, and fever. CXR showed BLL pneumonitis. She was COVID and flu negative. She was started on broad spec abx for sepsis w/ unknown source. TRH was called for admission.   Review of Systems:  Denies CP, dyspnea, palpitations, N/V/D, sick contacts, cough, congestion. Review of systems is otherwise negative for all not mentioned in HPI.   PMHx History reviewed. No pertinent past medical history.  PSHx Past Surgical History:  Procedure Laterality Date   BREAST EXCISIONAL BIOPSY Right     SocHx  reports that she has never smoked. She has never used smokeless tobacco. She reports that she does not drink alcohol and does not use drugs.  No Known Allergies  FamHx Family History  Problem Relation Age of Onset   Stomach cancer Father     Prior to Admission medications   Medication Sig Start Date End Date Taking? Authorizing Provider  cholecalciferol  (VITAMIN D3) 25 MCG (1000 UNIT) tablet Take 1 tablet (1,000 Units total) by mouth daily. 01/11/20  Yes Just, Azalee Course, FNP  Suvorexant (BELSOMRA) 10 MG TABS Take 10 mg by mouth at bedtime as needed. 12/10/20  Yes Etta Grandchild, MD    Physical Exam: Vitals:   01/20/21 0830 01/20/21 0845 01/20/21 0900 01/20/21 0915  BP: 114/68 109/65 103/66 112/68  Pulse: (!) 112 (!) 112 (!) 104 (!) 106  Resp: (!) 25 (!) 27 (!) 26 (!) 23  Temp:      TempSrc:      SpO2: 97% 98% 98% 97%  Weight:      Height:        General: 69 y.o. female resting in bed in NAD Eyes: PERRL, normal sclera ENMT: Nares patent w/o discharge, orophaynx clear, dentition normal, ears w/o discharge/lesions/ulcers Neck: Supple, trachea midline Cardiovascular: RRR, +S1, S2, no m/g/r, equal pulses throughout Respiratory: decreased at bases, no w/r/r, normal WOB on 2L [employer] GI: BS+, NDNT, no masses noted, no organomegaly noted MSK: No e/c/c Neuro: A&O x 3, no focal deficits Psyc: Slow on response and wandering answers initially, but calm/cooperative  Labs on Admission: I have personally reviewed following labs and imaging studies  CBC: Recent Labs  Lab 01/20/21 0157  WBC 10.3  NEUTROABS 9.2*  HGB 12.2  HCT 36.5  MCV 94.6  PLT 228   Basic Metabolic Panel: Recent Labs  Lab 01/20/21 0157  NA 135  K 3.4*  CL 103  CO2 25  GLUCOSE 160*  BUN 20  CREATININE 0.89  CALCIUM 8.8*   GFR: Estimated Creatinine Clearance: 38.6 mL/min (by C-G formula based on SCr of 0.89 mg/dL). Liver Function Tests: Recent Labs  Lab 01/20/21 0157  AST 21  ALT 15  ALKPHOS 64  BILITOT 0.5  PROT 7.7  ALBUMIN 4.2   Recent Labs  Lab 01/20/21 0157  LIPASE 43   No results for input(s): AMMONIA in the last 168 hours. Coagulation Profile: No results for input(s): INR, PROTIME in the last 168 hours. Cardiac Enzymes: No results for input(s): CKTOTAL, CKMB, CKMBINDEX, TROPONINI in the last 168 hours. BNP (last 3 results) No results for  input(s): PROBNP in the last 8760 hours. HbA1C: No results for input(s): HGBA1C in the last 72 hours. CBG: No results for input(s): GLUCAP in the last 168 hours. Lipid Profile: No results for input(s): CHOL, HDL, LDLCALC, TRIG, CHOLHDL, LDLDIRECT in the last 72 hours. Thyroid Function Tests: No results for input(s): TSH, T4TOTAL, FREET4, T3FREE, THYROIDAB in the last 72 hours. Anemia Panel: No results for input(s): VITAMINB12, FOLATE, FERRITIN, TIBC, IRON, RETICCTPCT in the last 72 hours. Urine analysis:    Component Value Date/Time   COLORURINE STRAW (A) 01/20/2021 0439   APPEARANCEUR CLEAR 01/20/2021 0439   LABSPEC 1.040 (H) 01/20/2021 0439   PHURINE 6.0 01/20/2021 0439   GLUCOSEU 50 (A) 01/20/2021 0439   HGBUR SMALL (A) 01/20/2021 0439   BILIRUBINUR NEGATIVE 01/20/2021 0439   BILIRUBINUR negative 07/04/2016 1644   KETONESUR 20 (A) 01/20/2021 0439   PROTEINUR NEGATIVE 01/20/2021 0439   UROBILINOGEN 0.2 07/04/2016 1644   NITRITE NEGATIVE 01/20/2021 0439   LEUKOCYTESUR NEGATIVE 01/20/2021 0439    Radiological Exams on Admission: CT ABDOMEN PELVIS W CONTRAST  Result Date: 01/20/2021 CLINICAL DATA:  Abdominal pain, nausea, bowel obstruction suspected EXAM: CT ABDOMEN AND PELVIS WITH CONTRAST TECHNIQUE: Multidetector CT imaging of the abdomen and pelvis was performed using the standard protocol following bolus administration of intravenous contrast. CONTRAST:  8mL OMNIPAQUE IOHEXOL 350 MG/ML SOLN COMPARISON:  None. FINDINGS: Lower chest: Lung bases are clear. Hepatobiliary: Liver is within normal limits. Gallbladder is unremarkable. No intrahepatic or extrahepatic ductal dilatation Pancreas: Within normal limits. Spleen: Within normal limits. Adrenals/Urinary Tract: Adrenal glands are within normal limits. Left kidney is within normal limits. Right kidney is notable for perirenal edema with perinephric fluid and mild right hydroureteronephrosis. Associated 2 mm distal right ureteral  calculus at the UVJ (series 2/image 69). Bladder is within normal limits. Stomach/Bowel: Stomach is within normal limits. No evidence of bowel obstruction. Normal appendix (series 2/image 58). No colonic wall thickening or inflammatory changes. Vascular/Lymphatic: No evidence of abdominal aortic aneurysm. Atherosclerotic calcifications of the abdominal aorta and branch vessels. No suspicious abdominopelvic lymphadenopathy. Reproductive: Uterus is within normal limits. Bilateral ovaries are within normal limits. Other: No abdominopelvic ascites. Musculoskeletal: Mild degenerative changes of the lower lumbar spine. IMPRESSION: 2 mm distal right ureteral calculus at the UVJ. Associated mild right hydroureteronephrosis. No evidence of bowel obstruction. Electronically Signed   By: Charline Bills M.D.   On: 01/20/2021 03:54   DG Chest Port 1 View  Result Date: 01/20/2021 CLINICAL DATA:  Fever EXAM: PORTABLE CHEST 1 VIEW COMPARISON:  11/01/2019 FINDINGS: Cardiac size is unremarkable. Central pulmonary vessels are prominent. There is poor inspiration. Interstitial markings in the parahilar regions and lower lung fields are prominent. There is no focal consolidation. There is no pleural effusion or pneumothorax. IMPRESSION: Central pulmonary vessels are prominent without signs of alveolar pulmonary edema. There is prominence of interstitial markings  in the parahilar regions and lower lung fields suggesting bronchitis or interstitial pneumonitis. Electronically Signed   By: Ernie Avena M.D.   On: 01/20/2021 08:39    EKG: None obtained in ED  Assessment/Plan Sepsis of unknown origin     - admit to inpt, tele     - UA doesn't show infection; imaging does not suggest pyelo     - CXR w/ pneumonitis -- which is our current best guess for a source of fever/infection; COVID/flu is negative; she denies cough/congestion, but she is hypoxic down to 88% on RA; let's check viral panel, procal, and continue abx  for now; will also check d-dimer     - IS, PRN nebs     - follow blood Cx  Mild right hydronephrosis w/ right 54mm renal stone     - renal function is fine and at that size, this stone should pass on its own     - continue hydration, pain control  Hypokalemia     - replace K+; check Mg2+  Hyperglycemia     - no previous history of DM; check A1c  UPDATE: Pt's pressures are worsening. She's already has 30cc/kg fluids. I have ordered another 1L bolus. She has strong pulses and mentation is about the same (still able to answer A&O x 3). Manual pressures showing 74/50. Starting levophed. Consulting PCCM. She will go to SDU.   DVT prophylaxis: lovenox  Code Status: FULL  Family Communication: Attempted call to husband, but received VM only  Consults called: None   Status is: Inpatient  Remains inpatient appropriate because: severity of illness  Time spent coordinating admission: 50 minutes  Makai Dumond A Taiyo Kozma DO [facility]  If 7PM-7AM, please contact night-coverage [email or web address]  01/20/2021, 9:28 AM

## 2021-01-20 NOTE — Anesthesia Postprocedure Evaluation (Signed)
Anesthesia Post Note  Patient: Wynonia Thi Renovato  Procedure(s) Performed: CYSTOSCOPY WITH STENT REPLACEMENT, RETROGRADE (Right)     Patient location during evaluation: PACU Anesthesia Type: General Level of consciousness: awake and alert, oriented and patient cooperative Pain management: pain level controlled Vital Signs Assessment: post-procedure vital signs reviewed and stable Respiratory status: spontaneous breathing, nonlabored ventilation and respiratory function stable Cardiovascular status: blood pressure returned to baseline and stable Postop Assessment: no apparent nausea or vomiting Anesthetic complications: no Comments: Will return to ICU on pressors, non-rebreather. Vitals stable   No notable events documented.  Last Vitals:  Vitals:   01/20/21 2037 01/20/21 2038  BP:  109/63  Pulse:    Resp: (!) 23 18  Temp:  37 C  SpO2:  96%    Last Pain:  Vitals:   01/20/21 2019  TempSrc:   PainSc: 0-No pain                 Lannie Fields

## 2021-01-20 NOTE — Anesthesia Procedure Notes (Signed)
Procedure Name: Intubation Date/Time: 01/20/2021 7:30 PM Performed by: Ezekiel Ina, CRNA Pre-anesthesia Checklist: Patient identified, Emergency Drugs available, Suction available and Patient being monitored Patient Re-evaluated:Patient Re-evaluated prior to induction Oxygen Delivery Method: Circle system utilized Preoxygenation: Pre-oxygenation with 100% oxygen Induction Type: IV induction Ventilation: Mask ventilation without difficulty Laryngoscope Size: Miller and 2 Grade View: Grade I Tube type: Oral Tube size: 7.0 mm Number of attempts: 1 Airway Equipment and Method: Stylet Placement Confirmation: ETT inserted through vocal cords under direct vision, positive ETCO2 and breath sounds checked- equal and bilateral Secured at: 19 cm Tube secured with: Tape Dental Injury: Teeth and Oropharynx as per pre-operative assessment

## 2021-01-21 ENCOUNTER — Inpatient Hospital Stay (HOSPITAL_COMMUNITY): Payer: Medicare Other

## 2021-01-21 ENCOUNTER — Encounter (HOSPITAL_COMMUNITY): Payer: Self-pay | Admitting: Urology

## 2021-01-21 DIAGNOSIS — N200 Calculus of kidney: Secondary | ICD-10-CM | POA: Diagnosis not present

## 2021-01-21 DIAGNOSIS — I361 Nonrheumatic tricuspid (valve) insufficiency: Secondary | ICD-10-CM | POA: Diagnosis not present

## 2021-01-21 DIAGNOSIS — R6521 Severe sepsis with septic shock: Secondary | ICD-10-CM | POA: Diagnosis not present

## 2021-01-21 DIAGNOSIS — A419 Sepsis, unspecified organism: Secondary | ICD-10-CM | POA: Diagnosis not present

## 2021-01-21 DIAGNOSIS — N1330 Unspecified hydronephrosis: Secondary | ICD-10-CM | POA: Diagnosis not present

## 2021-01-21 DIAGNOSIS — R778 Other specified abnormalities of plasma proteins: Secondary | ICD-10-CM | POA: Diagnosis not present

## 2021-01-21 LAB — CBC
HCT: 30 % — ABNORMAL LOW (ref 36.0–46.0)
Hemoglobin: 9.8 g/dL — ABNORMAL LOW (ref 12.0–15.0)
MCH: 31.4 pg (ref 26.0–34.0)
MCHC: 32.7 g/dL (ref 30.0–36.0)
MCV: 96.2 fL (ref 80.0–100.0)
Platelets: 120 10*3/uL — ABNORMAL LOW (ref 150–400)
RBC: 3.12 MIL/uL — ABNORMAL LOW (ref 3.87–5.11)
RDW: 13.7 % (ref 11.5–15.5)
WBC: 16.8 10*3/uL — ABNORMAL HIGH (ref 4.0–10.5)
nRBC: 0 % (ref 0.0–0.2)

## 2021-01-21 LAB — GLUCOSE, CAPILLARY
Glucose-Capillary: 137 mg/dL — ABNORMAL HIGH (ref 70–99)
Glucose-Capillary: 64 mg/dL — ABNORMAL LOW (ref 70–99)
Glucose-Capillary: 69 mg/dL — ABNORMAL LOW (ref 70–99)
Glucose-Capillary: 73 mg/dL (ref 70–99)
Glucose-Capillary: 76 mg/dL (ref 70–99)
Glucose-Capillary: 79 mg/dL (ref 70–99)
Glucose-Capillary: 84 mg/dL (ref 70–99)
Glucose-Capillary: 89 mg/dL (ref 70–99)
Glucose-Capillary: 91 mg/dL (ref 70–99)

## 2021-01-21 LAB — COMPREHENSIVE METABOLIC PANEL
ALT: 17 U/L (ref 0–44)
AST: 27 U/L (ref 15–41)
Albumin: 2.4 g/dL — ABNORMAL LOW (ref 3.5–5.0)
Alkaline Phosphatase: 46 U/L (ref 38–126)
Anion gap: 8 (ref 5–15)
BUN: 18 mg/dL (ref 8–23)
CO2: 16 mmol/L — ABNORMAL LOW (ref 22–32)
Calcium: 6.9 mg/dL — ABNORMAL LOW (ref 8.9–10.3)
Chloride: 113 mmol/L — ABNORMAL HIGH (ref 98–111)
Creatinine, Ser: 0.71 mg/dL (ref 0.44–1.00)
GFR, Estimated: >60 mL/min (ref >60)
Glucose, Bld: 87 mg/dL (ref 70–99)
Potassium: 4.3 mmol/L (ref 3.5–5.1)
Sodium: 137 mmol/L (ref 135–145)
Total Bilirubin: 0.4 mg/dL (ref 0.3–1.2)
Total Protein: 5 g/dL — ABNORMAL LOW (ref 6.5–8.1)

## 2021-01-21 LAB — ECHOCARDIOGRAM COMPLETE
Area-P 1/2: 3.42 cm2
Height: 57 in
S' Lateral: 2.8 cm
Weight: 1728.41 oz

## 2021-01-21 LAB — PROTIME-INR
INR: 1.6 — ABNORMAL HIGH (ref 0.8–1.2)
Prothrombin Time: 18.6 seconds — ABNORMAL HIGH (ref 11.4–15.2)

## 2021-01-21 LAB — LACTIC ACID, PLASMA: Lactic Acid, Venous: 1.4 mmol/L (ref 0.5–1.9)

## 2021-01-21 LAB — LEGIONELLA PNEUMOPHILA SEROGP 1 UR AG: L. pneumophila Serogp 1 Ur Ag: NEGATIVE

## 2021-01-21 LAB — PROCALCITONIN: Procalcitonin: 37.79 ng/mL

## 2021-01-21 MED ORDER — ASPIRIN EC 81 MG PO TBEC
81.0000 mg | DELAYED_RELEASE_TABLET | Freq: Every day | ORAL | Status: DC
  Administered 2021-01-21 – 2021-01-24 (×4): 81 mg via ORAL
  Filled 2021-01-21 (×5): qty 1

## 2021-01-21 MED ORDER — POTASSIUM CHLORIDE CRYS ER 20 MEQ PO TBCR
40.0000 meq | EXTENDED_RELEASE_TABLET | Freq: Once | ORAL | Status: AC
  Administered 2021-01-21: 40 meq via ORAL
  Filled 2021-01-21: qty 2

## 2021-01-21 MED ORDER — ACETAMINOPHEN 325 MG PO TABS
650.0000 mg | ORAL_TABLET | Freq: Four times a day (QID) | ORAL | Status: DC | PRN
  Administered 2021-01-21: 650 mg via ORAL
  Filled 2021-01-21: qty 2

## 2021-01-21 MED ORDER — ENOXAPARIN SODIUM 40 MG/0.4ML IJ SOSY
40.0000 mg | PREFILLED_SYRINGE | INTRAMUSCULAR | Status: DC
  Administered 2021-01-21 – 2021-01-24 (×4): 40 mg via SUBCUTANEOUS
  Filled 2021-01-21 (×4): qty 0.4

## 2021-01-21 MED ORDER — LIP MEDEX EX OINT
TOPICAL_OINTMENT | CUTANEOUS | Status: AC
  Administered 2021-01-21: 1 via TOPICAL
  Filled 2021-01-21: qty 7

## 2021-01-21 MED ORDER — SODIUM CHLORIDE 0.9% FLUSH: 10.0000 mL | INTRAVENOUS | Status: DC | PRN

## 2021-01-21 MED ORDER — LACTATED RINGERS IV BOLUS
1000.0000 mL | Freq: Once | INTRAVENOUS | Status: AC
  Administered 2021-01-21: 1000 mL via INTRAVENOUS

## 2021-01-21 MED ORDER — LIP MEDEX EX OINT: TOPICAL_OINTMENT | CUTANEOUS | Status: DC | PRN

## 2021-01-21 MED ORDER — DEXTROSE-NACL 5-0.9 % IV SOLN: INTRAVENOUS | Status: DC

## 2021-01-21 MED ORDER — FUROSEMIDE 10 MG/ML IJ SOLN
20.0000 mg | Freq: Once | INTRAMUSCULAR | Status: AC
  Administered 2021-01-21: 20 mg via INTRAVENOUS
  Filled 2021-01-21: qty 2

## 2021-01-21 MED ORDER — ORAL CARE MOUTH RINSE
15.0000 mL | Freq: Two times a day (BID) | OROMUCOSAL | Status: DC
  Administered 2021-01-21 – 2021-01-23 (×5): 15 mL via OROMUCOSAL

## 2021-01-21 MED ORDER — DEXTROSE 50 % IV SOLN
12.5000 g | INTRAVENOUS | Status: AC
  Administered 2021-01-21: 12.5 g via INTRAVENOUS
  Filled 2021-01-21: qty 50

## 2021-01-21 MED ORDER — SODIUM CHLORIDE 0.9% FLUSH
10.0000 mL | Freq: Two times a day (BID) | INTRAVENOUS | Status: DC
Start: 2021-01-21 — End: 2021-01-22
  Administered 2021-01-21 (×2): 10 mL

## 2021-01-21 NOTE — Progress Notes (Signed)
1 Day Post-Op  Subjective: Deborah Glass is improving s/p right stent insertion.   Stone was passed but Deborah Glass had pyonephrosis.  Being weaned from pressors.  Cr is down.   ROS:  ROS  Anti-infectives: Anti-infectives (From admission, onward)    Start     Dose/Rate Route Frequency Ordered Stop   01/21/21 1000  vancomycin (VANCOREADY) IVPB 500 mg/100 mL  Status:  Discontinued        500 mg 100 mL/hr over 60 Minutes Intravenous Every 24 hours 01/20/21 1118 01/20/21 2312   01/21/21 1000  cefTRIAXone (ROCEPHIN) 2 g in sodium chloride 0.9 % 100 mL IVPB        2 g 200 mL/hr over 30 Minutes Intravenous Every 24 hours 01/20/21 2312     01/20/21 2100  ceFEPIme (MAXIPIME) 2 g in sodium chloride 0.9 % 100 mL IVPB  Status:  Discontinued        2 g 200 mL/hr over 30 Minutes Intravenous Every 12 hours 01/20/21 1118 01/20/21 2312   01/20/21 0830  ceFEPIme (MAXIPIME) 2 g in sodium chloride 0.9 % 100 mL IVPB        2 g 200 mL/hr over 30 Minutes Intravenous  Once 01/20/21 0819 01/20/21 1111   01/20/21 0830  metroNIDAZOLE (FLAGYL) IVPB 500 mg        500 mg 100 mL/hr over 60 Minutes Intravenous  Once 01/20/21 0819 01/20/21 1112   01/20/21 0830  vancomycin (VANCOCIN) IVPB 1000 mg/200 mL premix        1,000 mg 200 mL/hr over 60 Minutes Intravenous  Once 01/20/21 9833 01/20/21 1112       Current Facility-Administered Medications  Medication Dose Route Frequency Provider Last Rate Last Admin   0.9 %  sodium chloride infusion  250 mL Intravenous Continuous Simonne Martinet, NP 10 mL/hr at 01/21/21 0700 Infusion Verify at 01/21/21 0700   acetaminophen (TYLENOL) suppository 650 mg  650 mg Rectal Q6H PRN Ronaldo Miyamoto, Tyrone A, DO       albuterol (PROVENTIL) (2.5 MG/3ML) 0.083% nebulizer solution 2.5 mg  2.5 mg Nebulization Q2H PRN Ronaldo Miyamoto, Tyrone A, DO       cefTRIAXone (ROCEPHIN) 2 g in sodium chloride 0.9 % 100 mL IVPB  2 g Intravenous Q24H Kalman Shan, MD       chlorhexidine (PERIDEX) 0.12 % solution 15 mL  15 mL Mouth  Rinse BID Kalman Shan, MD   15 mL at 01/20/21 2148   Chlorhexidine Gluconate Cloth 2 % PADS 6 each  6 each Topical Daily Kalman Shan, MD   6 each at 01/20/21 2100   dextrose 5 %-0.9 % sodium chloride infusion   Intravenous Continuous Oretha Milch, MD 50 mL/hr at 01/21/21 0700 Infusion Verify at 01/21/21 0700   enoxaparin (LOVENOX) injection 40 mg  40 mg Subcutaneous Q24H Kyle, Tyrone A, DO       insulin aspart (novoLOG) injection 0-6 Units  0-6 Units Subcutaneous Q4H Simonne Martinet, NP       norepinephrine (LEVOPHED) 4mg  in premix infusion  2-10 mcg/min Intravenous Titrated , NP   Stopped at 01/20/21 2336   ondansetron (ZOFRAN) injection 4 mg  4 mg Intravenous Q6H PRN 01/22/21, Tyrone A, DO   4 mg at 01/20/21 2006     Objective: Vital signs in last 24 hours: Temp:  [97.9 F (36.6 C)-102.7 F (39.3 C)] 98.6 F (37 C) (11/01 0335) Pulse Rate:  [73-114] 83 (11/01 0600) Resp:  [8-45] 17 (11/01 0600) BP: (  73-137)/(45-108) 118/54 (11/01 0350) SpO2:  [86 %-100 %] 100 % (11/01 0600) Arterial Line BP: (111-129)/(51-60) 118/53 (11/01 0600) Weight:  [49 kg] 49 kg (10/31 0815)  Intake/Output from previous day: 10/31 0701 - 11/01 0700 In: 1725 [I.V.:854.2; IV Piggyback:870.8] Out: 850 [Urine:850] Intake/Output this shift: No intake/output data recorded.   Physical Exam Vitals reviewed.  Constitutional:      Appearance: Deborah Glass is well-developed.  Neurological:     Mental Status: Deborah Glass is alert.    Lab Results:  Recent Labs    01/20/21 0157 01/21/21 0500  WBC 10.3 16.8*  HGB 12.2 9.8*  HCT 36.5 30.0*  PLT 228 120*   BMET Recent Labs    01/20/21 2159 01/21/21 0500  NA 139 137  K 3.7 4.3  CL 116* 113*  CO2 17* 16*  GLUCOSE 126* 87  BUN 18 18  CREATININE 0.87 0.71  CALCIUM 6.9* 6.9*   PT/INR Recent Labs    01/21/21 0530  LABPROT 18.6*  INR 1.6*   ABG Recent Labs    01/20/21 2205  PHART 7.309*  HCO3 17.4*    Studies/Results: CT  ABDOMEN PELVIS W CONTRAST  Result Date: 01/20/2021 CLINICAL DATA:  Abdominal pain, nausea, bowel obstruction suspected EXAM: CT ABDOMEN AND PELVIS WITH CONTRAST TECHNIQUE: Multidetector CT imaging of the abdomen and pelvis was performed using the standard protocol following bolus administration of intravenous contrast. CONTRAST:  59mL OMNIPAQUE IOHEXOL 350 MG/ML SOLN COMPARISON:  None. FINDINGS: Lower chest: Lung bases are clear. Hepatobiliary: Liver is within normal limits. Gallbladder is unremarkable. No intrahepatic or extrahepatic ductal dilatation Pancreas: Within normal limits. Spleen: Within normal limits. Adrenals/Urinary Tract: Adrenal glands are within normal limits. Left kidney is within normal limits. Right kidney is notable for perirenal edema with perinephric fluid and mild right hydroureteronephrosis. Associated 2 mm distal right ureteral calculus at the UVJ (series 2/image 69). Bladder is within normal limits. Stomach/Bowel: Stomach is within normal limits. No evidence of bowel obstruction. Normal appendix (series 2/image 58). No colonic wall thickening or inflammatory changes. Vascular/Lymphatic: No evidence of abdominal aortic aneurysm. Atherosclerotic calcifications of the abdominal aorta and branch vessels. No suspicious abdominopelvic lymphadenopathy. Reproductive: Uterus is within normal limits. Bilateral ovaries are within normal limits. Other: No abdominopelvic ascites. Musculoskeletal: Mild degenerative changes of the lower lumbar spine. IMPRESSION: 2 mm distal right ureteral calculus at the UVJ. Associated mild right hydroureteronephrosis. No evidence of bowel obstruction. Electronically Signed   By: Charline Bills M.D.   On: 01/20/2021 03:54   US RENAL  Result Date: 01/20/2021 CLINICAL DATA:  Hydronephrosis. EXAM: RENAL / URINARY TRACT ULTRASOUND COMPLETE COMPARISON:  CT abdomen pelvis 01/20/2021. FINDINGS: Right Kidney: Renal measurements: 10.1 x 4.9 x 5.3 cm = volume: 139 mL.  Echogenicity within normal limits. No mass. Mild hydronephrosis visualized. Left Kidney: Renal measurements: 10.3 x 5.5 x 4.9 cm = volume: 143 mL. Echogenicity within normal limits. No mass or hydronephrosis visualized. Urinary bladder: Appears normal for degree of bladder distention. Other: None. IMPRESSION: Mild right hydronephrosis. Electronically Signed   By: Tish Frederickson M.D.   On: 01/20/2021 15:56   DG Chest Port 1 View  Result Date: 01/20/2021 CLINICAL DATA:  Central line placement. EXAM: PORTABLE CHEST 1 VIEW COMPARISON:  Chest x-ray 12/23/2020. FINDINGS: There is a new right-sided central venous catheter with distal tip projecting over the mid SVC. The heart size and mediastinal contours are within normal limits. Both lungs are clear. The visualized skeletal structures are unremarkable. IMPRESSION: No active disease. Electronically Signed  By: Darliss Cheney M.D.   On: 01/20/2021 22:25   DG Chest Port 1 View  Result Date: 01/20/2021 CLINICAL DATA:  Fever EXAM: PORTABLE CHEST 1 VIEW COMPARISON:  11/01/2019 FINDINGS: Cardiac size is unremarkable. Central pulmonary vessels are prominent. There is poor inspiration. Interstitial markings in the parahilar regions and lower lung fields are prominent. There is no focal consolidation. There is no pleural effusion or pneumothorax. IMPRESSION: Central pulmonary vessels are prominent without signs of alveolar pulmonary edema. There is prominence of interstitial markings in the parahilar regions and lower lung fields suggesting bronchitis or interstitial pneumonitis. Electronically Signed   By: Ernie Avena M.D.   On: 01/20/2021 08:39   DG C-Arm 1-60 Min  Result Date: 01/20/2021 CLINICAL DATA:  Cystoscopy with stent replacement, right. EXAM: DG C-ARM 1-60 MIN FLUOROSCOPY TIME:  Fluoroscopy Time:  1 minute 54 seconds Radiation Exposure Index (if provided by the fluoroscopic device): Not provided. Number of Acquired Spot Images: 4 COMPARISON:   Abdomen pelvis CT earlier this day. FINDINGS: Four fluoroscopic spot views obtained during cystoscopy and right ureteral stent placement. Contrast ejected into the right ureter demonstrates dilatation of the renal collecting system and ureter. The stone on prior CT is not well seen on provided views. Ureteral stent is present on the final images. IMPRESSION: Procedural fluoroscopy during cystoscopy and right ureteral stent placement. Correlate with operative report for details. Electronically Signed   By: Narda Rutherford M.D.   On: 01/20/2021 20:13     Assessment and Plan: 50mm left distal ureteral stone with sepsis.   Stone passed in [place] but stent placed because of pyonephrosis.  Deborah Glass will need the stent removed in a week or two in the office.        LOS: 1 day    Bjorn Pippin 01/21/2021 [phone] Patient ID: Deborah Glass, female   DOB: Apr 09, 1950, 70 y.o.   MRN: [id number]

## 2021-01-21 NOTE — Progress Notes (Addendum)
NAME:  Deborah Glass, MRN:  [id number], DOB:  [birth year], LOS: 1 ADMISSION DATE:  01/20/2021, CONSULTATION DATE:  10/31 REFERRING MD: Ronaldo Miyamoto , CHIEF COMPLAINT:  septic shock    History of Present Illness:  This is a 70 year old female with English as a second language  presented to the [facility] emergency room on 10/31 with chief complaint of right flank pain, abdomen pain, nausea, vomiting, and fever.  Pain onset about 1 to 2 days, no sick exposures.  No dysuria but reported some frequency.  Initially presented tachycardic with heart rate 112 and mildly tachypneic.  CT abdomen obtained showed 2 mm distal right ureteral calculus at the UVJ, and mild associated right hydroureteronephrosis.  She was started on IV hydration, cultures were sent, and empiric antibiotics initiated.  After about 6 hours into her stay in the emergency room in spite of receiving 30 mL/kg predicted body weight IV fluid she became progressively hypotensive requiring initiation of norepinephrine peripherally.  Because of clinical decline critical care asked to evaluate.  Pertinent  Medical History  Hyperlipidemia, vitamin D deficiency. Significant Hospital Events: Including procedures, antibiotic start and stop dates in addition to other pertinent events   10/31 admitted with right flank pain.  CT abdomen with 2 mm distal right ureteral calculus and hydroureteronephrosis.  Cultures were sent, cefepime and vancomycin initiated, received 30 mL/kg IV fluid bolus in the emergency room.  Approximately 6 hours into her ER stay developed progressive hypotension Requiring addition of norepinephrine.  Was placed on supplemental oxygen in the ER however room air sats were 93%.  Additional data reporting critical troponin at 327, elevated BNP greater than 700, and mild interstitial changes on chest x-ray. Emergently went to OR immediately after arrival to ICU; underwent Cystoscopy, Right 24 fr ureteral stent placed. 54mm stone passed  intraop. + pyelonephritis. Aline and right IJ CVL placed  11/1 Weaned OFF pressors. E-Coli in blood/prelim GNR in urine. BID also detecting Enterobacterales in addition to e-coli. Changed BIPAP to PRN. Vanc stopped. Aline removed.   Interim History / Subjective:  Feels a little better  Objective   Blood pressure 129/66, pulse 82, temperature 99.2 F (37.3 C), temperature source Axillary, resp. rate 17, height 4\' 9"  (1.448 m), weight 49 kg, SpO2 99 %.    FiO2 (%):  [28 %] 28 %   Intake/Output Summary (Last 24 hours) at 01/21/2021 0840 Last data filed at 01/21/2021 0829 Gross per 24 hour  Intake 1724.99 ml  Output 1020 ml  Net 704.99 ml    Filed Weights   01/20/21 0815  Weight: 49 kg    Examination: General resting in bed on BIPAP no distress HENT some scleral edema. Mild JVD PERRL BIPAP mask intact Pulm dec bases no accessory use  PCXR reviewed personally showing continued interstitial markings c/w edema +/- element of atx Card RRR  Abd soft not tender Ext warm and dry brisk CR Neuro intact.   Resolved Hospital Problem list   Septic shock resolved  Assessment & Plan:  Severe sepsis/septic shock w/ E-coli bacteremia (POA) secondary to urinary tract source w/ associated right hydroureteronephrosis/pyelonephritis and noted right ureteral calculus now s/p Right ureteral stent by Dr 01/22/21 10/31 (passed during ureteral stent placement)  -shock resolved  Plan Dc vanc NSL IVFs Cont cefepime and narrow once sensitivities available  Dc aline  F/u w/ urology 2 weeks  Dyspnea w/ bilateral pulmonary infiltrates (POA)  -this progressed w/ volume resuscitation efforts and was associated w/ Trop bump and BNP  of 718; I favor edema +/- element of ALI Plan NSL IV Change BIPAP to PRN and provide supplemental oxygen for sats > 92% IV lasix 20mg  now AM CXR  Abnormal troponin and BNP w/ age undetermined BBB Likely r/t sepsis but can't r/o CM. She denied CP but did feel breathing  harder CEs trending down Plan F/u ECHO Cont tele  Add baby asa Cards eval if WM abnormality or decreased LV fxn on echo   Fluid and Electrolyte imbalance: NAGMA 2/2 hyperchloremia Plan NSL IVF Cl liq diet and adv as tol Am chem   Anemia and thrombocytopenia  Mix of dilution effect re: the hgb and thrombocytopenia likely d/t sepsis  Plan Ok to cont LMWH Am cbc  Hyperglycemia Plan Ssi glucise goal 140-180  Best Practice (right click and "Reselect all SmartList Selections" daily)   Diet/type: clear liquids DVT prophylaxis: LMWH GI prophylaxis: N/A Lines: Central line Foley:  Yes, and it is still needed Code Status:  full code Last date of multidisciplinary goals of care discussion [pending ]  Will look at her later this afternoon. Can prob go to tele and triad if no issues thru am   My cct 32 min  ACNP-BC [facility] Pulmonary/Critical Care Pager # [phone] OR # [phone] if no answer

## 2021-01-21 NOTE — Progress Notes (Signed)
  Echocardiogram 2D Echocardiogram has been performed.  Leta Jungling M 01/21/2021, 11:09 AM

## 2021-01-21 NOTE — Progress Notes (Signed)
At 19:45, patient was hypoglycemic= 64 but asymptomatic. Patient drank 120 ml of apple juice and BS=91 when rechecked. Will continue to monitor patient.

## 2021-01-21 NOTE — Progress Notes (Signed)
Pt seen, resting, appears comfortable, no increased wob/respiratory distress noted.  HR87, rr16, spo2 100% on 2l [place].  Bipap remains in room on standby but not indicated at this time.

## 2021-01-21 NOTE — Plan of Care (Signed)
  Problem: Fluid Volume: Goal: Hemodynamic stability will improve Outcome: Progressing   Problem: Clinical Measurements: Goal: Diagnostic test results will improve Outcome: Progressing Goal: Signs and symptoms of infection will decrease Outcome: Progressing   Problem: Respiratory: Goal: Ability to maintain adequate ventilation will improve Outcome: Progressing   Problem: Nutrition: Goal: Adequate nutrition will be maintained Outcome: Not Progressing

## 2021-01-22 DIAGNOSIS — B9620 Unspecified Escherichia coli [E. coli] as the cause of diseases classified elsewhere: Secondary | ICD-10-CM | POA: Diagnosis not present

## 2021-01-22 DIAGNOSIS — R519 Headache, unspecified: Secondary | ICD-10-CM

## 2021-01-22 DIAGNOSIS — R778 Other specified abnormalities of plasma proteins: Secondary | ICD-10-CM

## 2021-01-22 DIAGNOSIS — N132 Hydronephrosis with renal and ureteral calculous obstruction: Secondary | ICD-10-CM

## 2021-01-22 DIAGNOSIS — D696 Thrombocytopenia, unspecified: Secondary | ICD-10-CM

## 2021-01-22 DIAGNOSIS — A4151 Sepsis due to Escherichia coli [E. coli]: Principal | ICD-10-CM

## 2021-01-22 DIAGNOSIS — E8729 Other acidosis: Secondary | ICD-10-CM

## 2021-01-22 DIAGNOSIS — N12 Tubulo-interstitial nephritis, not specified as acute or chronic: Secondary | ICD-10-CM

## 2021-01-22 DIAGNOSIS — N10 Acute tubulo-interstitial nephritis: Secondary | ICD-10-CM | POA: Diagnosis not present

## 2021-01-22 DIAGNOSIS — R7881 Bacteremia: Secondary | ICD-10-CM

## 2021-01-22 DIAGNOSIS — R739 Hyperglycemia, unspecified: Secondary | ICD-10-CM

## 2021-01-22 DIAGNOSIS — D649 Anemia, unspecified: Secondary | ICD-10-CM

## 2021-01-22 DIAGNOSIS — J9601 Acute respiratory failure with hypoxia: Secondary | ICD-10-CM

## 2021-01-22 LAB — CBC
HCT: 30.8 % — ABNORMAL LOW (ref 36.0–46.0)
Hemoglobin: 10 g/dL — ABNORMAL LOW (ref 12.0–15.0)
MCH: 31.5 pg (ref 26.0–34.0)
MCHC: 32.5 g/dL (ref 30.0–36.0)
MCV: 97.2 fL (ref 80.0–100.0)
Platelets: 125 10*3/uL — ABNORMAL LOW (ref 150–400)
RBC: 3.17 MIL/uL — ABNORMAL LOW (ref 3.87–5.11)
RDW: 13.8 % (ref 11.5–15.5)
WBC: 20.1 10*3/uL — ABNORMAL HIGH (ref 4.0–10.5)
nRBC: 0 % (ref 0.0–0.2)

## 2021-01-22 LAB — COMPREHENSIVE METABOLIC PANEL
ALT: 15 U/L (ref 0–44)
AST: 20 U/L (ref 15–41)
Albumin: 2.4 g/dL — ABNORMAL LOW (ref 3.5–5.0)
Alkaline Phosphatase: 56 U/L (ref 38–126)
Anion gap: 8 (ref 5–15)
BUN: 12 mg/dL (ref 8–23)
CO2: 19 mmol/L — ABNORMAL LOW (ref 22–32)
Calcium: 7.5 mg/dL — ABNORMAL LOW (ref 8.9–10.3)
Chloride: 109 mmol/L (ref 98–111)
Creatinine, Ser: 0.58 mg/dL (ref 0.44–1.00)
GFR, Estimated: >60 mL/min (ref >60)
Glucose, Bld: 75 mg/dL (ref 70–99)
Potassium: 3.6 mmol/L (ref 3.5–5.1)
Sodium: 136 mmol/L (ref 135–145)
Total Bilirubin: 0.5 mg/dL (ref 0.3–1.2)
Total Protein: 5.2 g/dL — ABNORMAL LOW (ref 6.5–8.1)

## 2021-01-22 LAB — CULTURE, BLOOD (ROUTINE X 2): Special Requests: ADEQUATE

## 2021-01-22 LAB — GLUCOSE, CAPILLARY
Glucose-Capillary: 99 mg/dL (ref 70–99)
Glucose-Capillary: 82 mg/dL (ref 70–99)

## 2021-01-22 LAB — URINE CULTURE: Culture: 40000 — AB

## 2021-01-22 LAB — PROCALCITONIN: Procalcitonin: 21.86 ng/mL

## 2021-01-22 LAB — PHOSPHORUS
Phosphorus: 1.2 mg/dL — ABNORMAL LOW (ref 2.5–4.6)
Phosphorus: 1.7 mg/dL — ABNORMAL LOW (ref 2.5–4.6)

## 2021-01-22 LAB — MAGNESIUM: Magnesium: 1.7 mg/dL (ref 1.7–2.4)

## 2021-01-22 MED ORDER — CEFAZOLIN SODIUM-DEXTROSE 2-4 GM/100ML-% IV SOLN
2.0000 g | Freq: Three times a day (TID) | INTRAVENOUS | Status: DC
  Administered 2021-01-22 – 2021-01-24 (×6): 2 g via INTRAVENOUS
  Filled 2021-01-22 (×9): qty 100

## 2021-01-22 MED ORDER — ACETAMINOPHEN 325 MG PO TABS: 650.0000 mg | ORAL_TABLET | Freq: Four times a day (QID) | ORAL | Status: DC | PRN

## 2021-01-22 MED ORDER — IBUPROFEN 200 MG PO TABS
400.0000 mg | ORAL_TABLET | Freq: Four times a day (QID) | ORAL | Status: DC | PRN
  Administered 2021-01-22: 400 mg via ORAL
  Filled 2021-01-22: qty 2

## 2021-01-22 MED ORDER — POTASSIUM PHOSPHATES 15 MMOLE/5ML IV SOLN
45.0000 mmol | Freq: Once | INTRAVENOUS | Status: AC
  Administered 2021-01-22: 45 mmol via INTRAVENOUS
  Filled 2021-01-22: qty 15

## 2021-01-22 MED ORDER — MAGNESIUM SULFATE 2 GM/50ML IV SOLN
2.0000 g | Freq: Once | INTRAVENOUS | Status: AC
  Administered 2021-01-22: 2 g via INTRAVENOUS
  Filled 2021-01-22: qty 50

## 2021-01-22 NOTE — Progress Notes (Signed)
Report was given to [place], [place] RN. All questions answered at this time. All pt belongings and paper chart transported with pt. Pt was transported in the wheelchair by this RN on tele. 6E will continue care of pt.

## 2021-01-22 NOTE — Progress Notes (Addendum)
NAME:  Deborah Glass, MRN:  [id number], DOB:  [birth year], LOS: 2 ADMISSION DATE:  01/20/2021, CONSULTATION DATE:  10/31 REFERRING MD: Ronaldo Miyamoto , CHIEF COMPLAINT:  septic shock    History of Present Illness:  This is a 70 year old female with English as a second language  presented to the [facility] [place] emergency room on 10/31 with chief complaint of right flank pain, abdomen pain, nausea, vomiting, and fever.  Pain onset about 1 to 2 days, no sick exposures.  No dysuria but reported some frequency.  Initially presented tachycardic with heart rate 112 and mildly tachypneic.  CT abdomen obtained showed 2 mm distal right ureteral calculus at the UVJ, and mild associated right hydroureteronephrosis.  She was started on IV hydration, cultures were sent, and empiric antibiotics initiated.  After about 6 hours into her stay in the emergency room in spite of receiving 30 mL/kg predicted body weight IV fluid she became progressively hypotensive requiring initiation of norepinephrine peripherally.  Because of clinical decline critical care asked to evaluate.  Pertinent  Medical History  Hyperlipidemia, vitamin D deficiency. Significant Hospital Events: Including procedures, antibiotic start and stop dates in addition to other pertinent events   10/31 admitted with right flank pain.  CT abdomen with 2 mm distal right ureteral calculus and hydroureteronephrosis.  Cultures were sent, cefepime and vancomycin initiated, received 30 mL/kg IV fluid bolus in the emergency room. Cefepime changed to ceftriaxone.  Approximately 6 hours into her ER stay developed progressive hypotension Requiring addition of norepinephrine.  Was placed on supplemental oxygen in the ER however room air sats were 93%.  Additional data reporting critical troponin at 327, elevated BNP greater than 700, and mild interstitial changes on chest x-ray. Emergently went to OR immediately after arrival to ICU; underwent Cystoscopy, Right 24 fr ureteral stent  placed. 71mm stone passed intraop. + pyelonephritis. Aline and right IJ CVL placed  11/1 Weaned OFF pressors. E-Coli in blood/prelim GNR in urine. BID also detecting Enterobacterales in addition to e-coli. Changed BIPAP to PRN. Vanc stopped. Aline removed.  Got lasix but then required IV fluid bolus for hypotension. ECHO completed. NML wall motion and good LV fxn. CVL removed.  11/2 WBC still up. Still awaiting final sensitivities. Poor appetite. IM service assuming care.   Interim History / Subjective:  Did not sleep well  Objective   Blood pressure 134/63, pulse 80, temperature 98.1 F (36.7 C), temperature source Oral, resp. rate 17, height 4\' 9"  (1.448 m), weight 49 kg, SpO2 100 %.    FiO2 (%):  [28 %] 28 %   Intake/Output Summary (Last 24 hours) at 01/22/2021 0741 Last data filed at 01/22/2021 0700 Gross per 24 hour  Intake 1667.4 ml  Output 1866 ml  Net -198.6 ml   Filed Weights   01/20/21 0815  Weight: 49 kg    Examination: General resting in bed HENT NCAT no JVD Pulm CTA 2 lpm Card rrr w/out MRG Abd soft Neuro intact Ext warm and dry   Final dx list at time of CCM sign off  Septic shock resolved->sepsis resolved Hyperglycemia  Dyspnea  E-coli bacteremia (POA) secondary to urinary tract source w/ associated right hydroureteronephrosis/pyelonephritis and noted right ureteral calculus now s/p Right ureteral stent by Dr 01/22/21 10/31 (passed during ureteral stent placement)  Dyspnea w/ bilateral pulmonary infiltrates (POA)  Abnormal troponin and BNP w/ age undetermined BBB Fluid and Electrolyte imbalance: NAGMA 2/2 hyperchloremia Anemia and thrombocytopenia  Poor appetite  Assessment & Plan:  E-coli bacteremia (  POA) secondary to urinary tract source w/ associated right hydroureteronephrosis/pyelonephritis and noted right ureteral calculus now s/p Right ureteral stent by Dr Annabell Howells 10/31 (passed during ureteral stent placement)  -shock resolved  Plan Abx day 3; on CTX  awaiting sensitivities (discussed w/ IM service)  Dyspnea w/ bilateral pulmonary infiltrates (POA)  -favoring mild ALI in sepsis. Got lasix but then required IVFs for hypotension Plan Wean oxygen  IS  mobilize  Abnormal troponin and BNP w/ age undetermined BBB Likely r/t sepsis, ECHO w/out WM abnormality OR depressed LV fxn Plan Baby asa Could consider OP referral to cards   Fluid and Electrolyte imbalance: NAGMA 2/2 hyperchloremia ->improving  Plan NSL IV Encourage PO intake  Anemia and thrombocytopenia  Mix of dilution effect re: the hgb and thrombocytopenia likely d/t sepsis PLTs improving Plan LMWH  Poor appetite Plan  Adv diet   Best Practice (right click and "Reselect all SmartList Selections" daily)   Diet/type: clear liquids DVT prophylaxis: LMWH GI prophylaxis: N/A Lines: Central line Foley:  Yes, and it is still needed Code Status:  full code Last date of multidisciplinary goals of care discussion [pending ]  Final note. Care and sign off discussed w/ Dr Caleb Popp who has assumed primary care.  We will be available as needed.   Simonne Martinet ACNP-BC [facility] Pulmonary/Critical Care Pager # [phone] OR # [phone] if no answer

## 2021-01-22 NOTE — Progress Notes (Signed)
[facility] ADULT ICU REPLACEMENT PROTOCOL   The patient does apply for the [facility] Adult ICU Electrolyte Replacment Protocol based on the criteria listed below:   1.Exclusion criteria: TCTS patients, ECMO patients, and Dialysis patients 2. Is GFR >/= 30 ml/min? Yes.    Patient's GFR today is >60 3. Is SCr </= 2? Yes.   Patient's SCr is 0.58 mg/dL 4. Did SCr increase >/= 0.5 in 24 hours? No. 5.Pt's weight >40kg  Yes.   6. Abnormal electrolyte(s): K 3.6, Phos 1.2, mag 1.7  7. Electrolytes replaced per protocol 8.  Call MD STAT for K+ </= 2.5, Phos </= 1, or Mag </= 1 Physician:  George Ina, [place] Patrone A 01/22/2021 5:29 AM

## 2021-01-22 NOTE — Progress Notes (Signed)
PROGRESS NOTE    Deborah Glass  ATF:[id number] DOB: [birth year] DOA: 01/20/2021 PCP: Janith Lima, MD   Brief Narrative: Deborah Glass is a 70 y.o. female with a history of hyperlipidemia and vitamin D deficiency. Patient presented secondary to abdominal pain and fever and found to have evidence of a right ureteral stone and resultant hydroureteronephrosis; she also met criteria for SIRS with unknown source. During admission, she developed severe hypotension requiring initiation of vasopressors. Blood and urine cultures significant for E. Coli. Right ureteral stent placed on 10/31   Assessment & Plan:   Principal Problem:   Septic shock ([place]) Active Problems:   Sepsis ([place])   Dyspnea   Hydronephrosis   Kidney stone   Acute pyelonephritis   E. coli bacteremia   Septic shock Sepsis present on admission with development of septic shock requiring vasopressor support from 10/31 to 11/1 and requiring ICU admission from 10/31 to 11/1. Secondary to E. coli bacteremia (no ESBL detected on BCID) and pyelonephritis. Patient was empirically started on Vancomycin and Cefepime which has been transitioned to Ceftriaxone IV. Leukocytosis is worsening now, however.  -Follow-up repeat procalcitonin -May need to broaden antibiotics but culture sensitivities should hopefully be available today -Daily CBC  E. Coli bacteremia Pyelonephritis Likely secondary to ureteral stone. Antibiotics course as mentioned above. WBC trending upwards without recurrent fever -Continue Ceftriaxone IV  Respiratory distress Acute respiratory failure with hypoxia Likely secondary to edema. Patient managed initially with BiPAP and transitioned to supplemental oxygen via [place]. Lasix given for treatment of presumed edema with improvement of symptoms.  Right hydronephrosis Right obstructing ureteral calculus Patient underwent cystoscopy with right ureteral stent placement on 10/31. 2 mm stone passed during  procedure. -Urology recommendations: stent removal in 1-2 weeks in outpatient office  Headache -ibuprofen prn  Hypokalemia Repleted. Resolved.  Elevated troponin Peak of 327 with downward trend. In setting of severe sepsis. Transthoracic Echocardiogram obtained and significant for no WMA and preserved ejection fraction.    Non-anion gap metabolic acidosis Improved with IV fluids.  Anemia Acute. No evidence of bleeding. In setting of severe infection. Stable.  Thrombocytopenia Acute. In setting of severe infection. Stable.  Hyperglycemia Hemoglobin A1C of 5.4%   DVT prophylaxis: Lovenox Code Status:   Code Status: Full Code Family Communication: None at bedside Disposition Plan: Discharge home vs SNF likely in 3-5 days pending transition to outpatient antibiotic regimen, PT/OT recommendations, urology recommendations for discharge   Consultants:  Urology PCCM  Procedures:  [place] (01/20/2021) TRANSTHORACIC ECHOCARDIOGRAM (01/21/2021) IMPRESSIONS     1. Left ventricular ejection fraction, by estimation, is 60 to 65%. The  left ventricle has normal function. The left ventricle has no regional  wall motion abnormalities. Left ventricular diastolic parameters were  normal.   2. Right ventricular systolic function is normal. The right ventricular  size is normal.   3. The mitral valve is normal in structure. Trivial mitral valve  regurgitation.   4. The aortic valve is tricuspid.   5. The inferior vena cava is normal in size with greater than 50%  respiratory variability, suggesting right atrial pressure of 3 mmHg.   Antimicrobials: Vancomycin Cefepime Ceftriaxone    Subjective: Patient reports a headache. No dyspnea this morning. Did have some chest tightness last night. Some right sided flank pain that is improved.  Objective: Vitals:   01/22/21 0200 01/22/21 0400 01/22/21 0402 01/22/21 0621  BP: (!) 141/68 (!) 151/73   134/63  Pulse: 92 100  90  Resp: 20 (!) 22  (!) 22  Temp:   99.1 F (37.3 C)   TempSrc:   Oral   SpO2: 99% 97%  100%  Weight:      Height:        Intake/Output Summary (Last 24 hours) at 01/22/2021 0657 Last data filed at 01/22/2021 0610 Gross per 24 hour  Intake 1606.47 ml  Output 1866 ml  Net -259.53 ml   Filed Weights   01/20/21 0815  Weight: 49 kg    Examination:  General exam: Appears calm and comfortable and in no acute distress. Conversant Respiratory: Clear to auscultation. Respiratory effort normal with no intercostal retractions or use of accessory muscles Cardiovascular: S1 & S2 heard, RRR. No murmurs, rubs, gallops or clicks. No edema Gastrointestinal: Abdomen is mildly distended, soft and with right flank tenderness. No masses felt. Normal bowel sounds heard Neurologic: No focal neurological deficits Musculoskeletal: No calf tenderness Skin: No cyanosis. No new rashes Psychiatry: Alert and oriented. Memory intact. Mood & affect appropriate    Data Reviewed: I have personally reviewed following labs and imaging studies  CBC Lab Results  Component Value Date   WBC 20.1 (H) 01/22/2021   RBC 3.17 (L) 01/22/2021   HGB 10.0 (L) 01/22/2021   HCT 30.8 (L) 01/22/2021   MCV 97.2 01/22/2021   MCH 31.5 01/22/2021   PLT 125 (L) 01/22/2021   MCHC 32.5 01/22/2021   RDW 13.8 01/22/2021   LYMPHSABS 0.6 (L) 01/20/2021   MONOABS 0.4 01/20/2021   EOSABS 0.0 01/20/2021   BASOSABS 0.0 59/56/3875     Last metabolic panel Lab Results  Component Value Date   NA 136 01/22/2021   K 3.6 01/22/2021   CL 109 01/22/2021   CO2 19 (L) 01/22/2021   BUN 12 01/22/2021   CREATININE 0.58 01/22/2021   GLUCOSE 75 01/22/2021   GFRNONAA >60 01/22/2021   GFRAA 108 01/10/2020   CALCIUM 7.5 (L) 01/22/2021   PHOS 1.2 (L) 01/22/2021   PROT 5.2 (L) 01/22/2021   ALBUMIN 2.4 (L) 01/22/2021   LABGLOB 2.7 07/04/2016   AGRATIO 1.6 07/04/2016   BILITOT 0.5 01/22/2021   ALKPHOS 56  01/22/2021   AST 20 01/22/2021   ALT 15 01/22/2021   ANIONGAP 8 01/22/2021    CBG (last 3)  Recent Labs    01/21/21 2007 01/21/21 2339 01/22/21 0357  GLUCAP 91 79 99     GFR: Estimated Creatinine Clearance: 42.9 mL/min (by C-G formula based on SCr of 0.58 mg/dL).  Coagulation Profile: Recent Labs  Lab 01/21/21 0530  INR 1.6*    Recent Results (from the past 240 hour(s))  Resp Panel by RT-PCR (Flu A&B, Covid) Nasopharyngeal Swab     Status: None   Collection Time: 01/20/21  5:15 AM   Specimen: Nasopharyngeal Swab; Nasopharyngeal(NP) swabs in vial transport medium  Result Value Ref Range Status   SARS Coronavirus 2 by RT PCR NEGATIVE NEGATIVE Final    Comment: (NOTE) SARS-CoV-2 target nucleic acids are NOT DETECTED.  The SARS-CoV-2 RNA is generally detectable in upper respiratory specimens during the acute phase of infection. The lowest concentration of SARS-CoV-2 viral copies this assay can detect is 138 copies/mL. A negative result does not preclude SARS-Cov-2 infection and should not be used as the sole basis for treatment or other patient management decisions. A negative result may occur with  improper specimen collection/handling, submission of specimen other than nasopharyngeal swab, presence of viral mutation(s) within the areas targeted by this assay,  and inadequate number of viral copies(<138 copies/mL). A negative result must be combined with clinical observations, patient history, and epidemiological information. The expected result is Negative.  Fact Sheet for Patients:  EntrepreneurPulse.com.au  Fact Sheet for Healthcare Providers:  IncredibleEmployment.be  This test is no t yet approved or cleared by the [place] FDA and  has been authorized for detection and/or diagnosis of SARS-CoV-2 by FDA under an Emergency Use Authorization (EUA). This EUA will remain  in effect (meaning this test can be used) for the  duration of the COVID-19 declaration under Section 564(b)(1) of the Act, 21 U.S.C.section 360bbb-3(b)(1), unless the authorization is terminated  or revoked sooner.       Influenza A by PCR NEGATIVE NEGATIVE Final   Influenza B by PCR NEGATIVE NEGATIVE Final    Comment: (NOTE) The Xpert Xpress SARS-CoV-2/FLU/RSV plus assay is intended as an aid in the diagnosis of influenza from Nasopharyngeal swab specimens and should not be used as a sole basis for treatment. Nasal washings and aspirates are unacceptable for Xpert Xpress SARS-CoV-2/FLU/RSV testing.  Fact Sheet for Patients: EntrepreneurPulse.com.au  Fact Sheet for Healthcare Providers: IncredibleEmployment.be  This test is not yet approved or cleared by the [place] FDA and has been authorized for detection and/or diagnosis of SARS-CoV-2 by FDA under an Emergency Use Authorization (EUA). This EUA will remain in effect (meaning this test can be used) for the duration of the COVID-19 declaration under Section 564(b)(1) of the Act, 21 U.S.C. section 360bbb-3(b)(1), unless the authorization is terminated or revoked.  Performed at [facility], [place] [address], [place], [place] [address]   Blood culture (routine x 2)     Status: Abnormal (Preliminary result)   Collection Time: 01/20/21  8:19 AM   Specimen: BLOOD LEFT HAND  Result Value Ref Range Status   Specimen Description   Final    BLOOD LEFT HAND Performed at [facility] [address], [place], [place] [address]    Special Requests   Final    BOTTLES DRAWN AEROBIC AND ANAEROBIC Blood Culture adequate volume Performed at [facility] [address] [place], [place] [address]    Culture  Setup Time   Final    GRAM NEGATIVE RODS IN BOTH AEROBIC AND ANAEROBIC BOTTLES CRITICAL RESULT CALLED TO, READ BACK BY AND VERIFIED WITH: PHARMD MICHELLE LILLISTON 01/20/2021 2249 BY  JW    Culture (A)  Final    ESCHERICHIA COLI SUSCEPTIBILITIES TO FOLLOW Performed at [facility], [place] [address] [place], [place] [address]    Report Status PENDING  Incomplete  Blood Culture ID Panel (Reflexed)     Status: Abnormal   Collection Time: 01/20/21  8:19 AM  Result Value Ref Range Status   Enterococcus faecalis NOT DETECTED NOT DETECTED Final   Enterococcus Faecium NOT DETECTED NOT DETECTED Final   Listeria monocytogenes NOT DETECTED NOT DETECTED Final   Staphylococcus species NOT DETECTED NOT DETECTED Final   Staphylococcus aureus (BCID) NOT DETECTED NOT DETECTED Final   Staphylococcus epidermidis NOT DETECTED NOT DETECTED Final   Staphylococcus lugdunensis NOT DETECTED NOT DETECTED Final   Streptococcus species NOT DETECTED NOT DETECTED Final   Streptococcus agalactiae NOT DETECTED NOT DETECTED Final   Streptococcus pneumoniae NOT DETECTED NOT DETECTED Final   Streptococcus pyogenes NOT DETECTED NOT DETECTED Final   A.calcoaceticus-baumannii NOT DETECTED NOT DETECTED Final   Bacteroides fragilis NOT DETECTED NOT DETECTED Final   Enterobacterales DETECTED (A) NOT DETECTED Final  Comment: Enterobacterales represent a large order of gram negative bacteria, not a single organism. CRITICAL RESULT CALLED TO, READ BACK BY AND VERIFIED WITH: PHARMD MICHELLE LILLISTON 01/20/2021 2249 BY JW    Enterobacter cloacae complex NOT DETECTED NOT DETECTED Final   Escherichia coli DETECTED (A) NOT DETECTED Final    Comment: CRITICAL RESULT CALLED TO, READ BACK BY AND VERIFIED WITH: PHARMD MICHELLE LILLISTON 01/20/2021 2249 BY JW    Klebsiella aerogenes NOT DETECTED NOT DETECTED Final   Klebsiella oxytoca NOT DETECTED NOT DETECTED Final   Klebsiella pneumoniae NOT DETECTED NOT DETECTED Final   Proteus species NOT DETECTED NOT DETECTED Final   Salmonella species NOT DETECTED NOT DETECTED Final   Serratia marcescens NOT DETECTED NOT DETECTED Final   Haemophilus influenzae  NOT DETECTED NOT DETECTED Final   Neisseria meningitidis NOT DETECTED NOT DETECTED Final   Pseudomonas aeruginosa NOT DETECTED NOT DETECTED Final   Stenotrophomonas maltophilia NOT DETECTED NOT DETECTED Final   Candida albicans NOT DETECTED NOT DETECTED Final   Candida auris NOT DETECTED NOT DETECTED Final   Candida glabrata NOT DETECTED NOT DETECTED Final   Candida krusei NOT DETECTED NOT DETECTED Final   Candida parapsilosis NOT DETECTED NOT DETECTED Final   Candida tropicalis NOT DETECTED NOT DETECTED Final   Cryptococcus neoformans/gattii NOT DETECTED NOT DETECTED Final   CTX-M ESBL NOT DETECTED NOT DETECTED Final   Carbapenem resistance IMP NOT DETECTED NOT DETECTED Final   Carbapenem resistance KPC NOT DETECTED NOT DETECTED Final   Carbapenem resistance NDM NOT DETECTED NOT DETECTED Final   Carbapenem resist OXA 48 LIKE NOT DETECTED NOT DETECTED Final   Carbapenem resistance VIM NOT DETECTED NOT DETECTED Final    Comment: Performed at [facility], [address] [place], [place] [address]  Urine Culture     Status: Abnormal (Preliminary result)   Collection Time: 01/20/21  8:20 AM   Specimen: Urine, Clean Catch  Result Value Ref Range Status   Specimen Description   Final    URINE, CLEAN CATCH Performed at [facility], [place] [address] [place], [place] [address]    Special Requests   Final    NONE Performed at [facility], [place] [address] [place], [place] [address]    Culture (A)  Final    40,000 COLONIES/mL ESCHERICHIA COLI SUSCEPTIBILITIES TO FOLLOW Performed at [facility], [place] [address] [place], [place] [address]    Report Status PENDING  Incomplete  Blood culture (routine x 2)     Status: None (Preliminary result)   Collection Time: 01/20/21  8:24 AM   Specimen: BLOOD  Result Value Ref Range Status   Specimen Description   Final    BLOOD RIGHT ANTECUBITAL Performed at [facility]  [address], [place], [place] [address]    Special Requests   Final    BOTTLES DRAWN AEROBIC AND ANAEROBIC BACTERIAL CASTS Performed at [place] [address], [place], [place] [address]    Culture   Final    NO GROWTH < 24 HOURS Performed at [facility] [address], [place], [place] [address]    Report Status PENDING  Incomplete  MRSA Next Gen by PCR, Nasal     Status: None   Collection Time: 01/20/21  2:25 PM   Specimen: Nasal Mucosa; Nasal Swab  Result Value Ref Range Status   MRSA by PCR Next Gen NOT DETECTED NOT DETECTED Final  Comment: (NOTE) The GeneXpert MRSA Assay (FDA approved for NASAL specimens only), is one component of a comprehensive MRSA colonization surveillance program. It is not intended to diagnose MRSA infection nor to guide or monitor treatment for MRSA infections. Test performance is not FDA approved in patients less than 67 years old. Performed at [facility], [place] [address] [place], [place] [address]   MRSA Next Gen by PCR, Nasal     Status: None   Collection Time: 01/20/21  9:04 PM   Specimen: Nasal Mucosa; Nasal Swab  Result Value Ref Range Status   MRSA by PCR Next Gen NOT DETECTED NOT DETECTED Final    Comment: (NOTE) The GeneXpert MRSA Assay (FDA approved for NASAL specimens only), is one component of a comprehensive MRSA colonization surveillance program. It is not intended to diagnose MRSA infection nor to guide or monitor treatment for MRSA infections. Test performance is not FDA approved in patients less than 9 years old. Performed at [facility], [place] [address] [place], [place] [address]         Radiology Studies: US RENAL  Result Date: 01/20/2021 CLINICAL DATA:  Hydronephrosis. EXAM: RENAL / URINARY TRACT ULTRASOUND COMPLETE COMPARISON:  CT abdomen pelvis 01/20/2021. FINDINGS: Right Kidney: Renal measurements: 10.1 x 4.9 x 5.3 cm = volume: 139 mL.  Echogenicity within normal limits. No mass. Mild hydronephrosis visualized. Left Kidney: Renal measurements: 10.3 x 5.5 x 4.9 cm = volume: 143 mL. Echogenicity within normal limits. No mass or hydronephrosis visualized. Urinary bladder: Appears normal for degree of bladder distention. Other: None. IMPRESSION: Mild right hydronephrosis. Electronically Signed   By: Iven Finn M.D.   On: 01/20/2021 15:56   DG Chest Port 1 View  Result Date: 01/21/2021 CLINICAL DATA:  Shortness of breath EXAM: PORTABLE CHEST 1 VIEW COMPARISON:  Previous studies including the examination of 01/20/2021 FINDINGS: Cardiac size is within normal limits. There are no signs of pulmonary edema. Increased markings are seen in the medial left lower lung fields with no significant interval change. There is minimal blunting of left lateral CP angle. There is no pneumothorax. Tip of central venous catheter is seen in superior vena cava. IMPRESSION: Increased markings in the medial left lower lung fields may suggest crowding of bronchovascular structures or subsegmental atelectasis. Electronically Signed   By: Elmer Picker M.D.   On: 01/21/2021 08:05   DG Chest Port 1 View  Result Date: 01/20/2021 CLINICAL DATA:  Central line placement. EXAM: PORTABLE CHEST 1 VIEW COMPARISON:  Chest x-ray 12/23/2020. FINDINGS: There is a new right-sided central venous catheter with distal tip projecting over the mid SVC. The heart size and mediastinal contours are within normal limits. Both lungs are clear. The visualized skeletal structures are unremarkable. IMPRESSION: No active disease. Electronically Signed   By: Ronney Asters M.D.   On: 01/20/2021 22:25   DG Chest Port 1 View  Result Date: 01/20/2021 CLINICAL DATA:  Fever EXAM: PORTABLE CHEST 1 VIEW COMPARISON:  11/01/2019 FINDINGS: Cardiac size is unremarkable. Central pulmonary vessels are prominent. There is poor inspiration. Interstitial markings in the parahilar regions and lower  lung fields are prominent. There is no focal consolidation. There is no pleural effusion or pneumothorax. IMPRESSION: Central pulmonary vessels are prominent without signs of alveolar pulmonary edema. There is prominence of interstitial markings in the parahilar regions and lower lung fields suggesting bronchitis or interstitial pneumonitis. Electronically Signed   By: Elmer Picker M.D.   On: 01/20/2021 08:39   DG  C-Arm 1-60 Min  Result Date: 01/20/2021 CLINICAL DATA:  Cystoscopy with stent replacement, right. EXAM: DG C-ARM 1-60 MIN FLUOROSCOPY TIME:  Fluoroscopy Time:  1 minute 54 seconds Radiation Exposure Index (if provided by the fluoroscopic device): Not provided. Number of Acquired Spot Images: 4 COMPARISON:  Abdomen pelvis CT earlier this day. FINDINGS: Four fluoroscopic spot views obtained during cystoscopy and right ureteral stent placement. Contrast ejected into the right ureter demonstrates dilatation of the renal collecting system and ureter. The stone on prior CT is not well seen on provided views. Ureteral stent is present on the final images. IMPRESSION: Procedural fluoroscopy during cystoscopy and right ureteral stent placement. Correlate with operative report for details. Electronically Signed   By: Keith Rake M.D.   On: 01/20/2021 20:13   ECHOCARDIOGRAM COMPLETE  Result Date: 01/21/2021    ECHOCARDIOGRAM REPORT   Patient Name:   Chalice THI Domke Date of Exam: 01/21/2021 Medical Rec #:  [id number]       Height:       57.0 in Accession #:    [id number]      Weight:       108.0 lb Date of Birth:  1950-12-11       BSA:          1.384 m Patient Age:    10 years        BP:           117/60 mmHg Patient Gender: F               HR:           83 bpm. Exam Location:  Inpatient Procedure: 2D Echo, Cardiac Doppler and Color Doppler Indications:    Elevated Troponin  History:        Patient has no prior history of Echocardiogram examinations.                 Sepsis of unknown origin. Mild  right hydronephrosis w/ right                 renal stone.  Sonographer:    Darlina Sicilian RDCS Referring Phys: [id number] [place]  1. Left ventricular ejection fraction, by estimation, is 60 to 65%. The left ventricle has normal function. The left ventricle has no regional wall motion abnormalities. Left ventricular diastolic parameters were normal.  2. Right ventricular systolic function is normal. The right ventricular size is normal.  3. The mitral valve is normal in structure. Trivial mitral valve regurgitation.  4. The aortic valve is tricuspid.  5. The inferior vena cava is normal in size with greater than 50% respiratory variability, suggesting right atrial pressure of 3 mmHg. FINDINGS  Left Ventricle: Left ventricular ejection fraction, by estimation, is 60 to 65%. The left ventricle has normal function. The left ventricle has no regional wall motion abnormalities. The left ventricular internal cavity size was normal in size. There is  no left ventricular hypertrophy. Left ventricular diastolic parameters were normal. Right Ventricle: The right ventricular size is normal. Right vetricular wall thickness was not assessed. Right ventricular systolic function is normal. Left Atrium: Left atrial size was normal in size. Right Atrium: Right atrial size was normal in size. Pericardium: There is no evidence of pericardial effusion. Mitral Valve: The mitral valve is normal in structure. Trivial mitral valve regurgitation. Tricuspid Valve: The tricuspid valve is normal in structure. Tricuspid valve regurgitation is mild. Aortic Valve: The aortic valve is tricuspid. Pulmonic Valve: The pulmonic  valve was not well visualized. Pulmonic valve regurgitation is not visualized. Aorta: The aortic root and ascending aorta are structurally normal, with no evidence of dilitation. Venous: The inferior vena cava is normal in size with greater than 50% respiratory variability, suggesting right atrial pressure of 3  mmHg. IAS/Shunts: No atrial level shunt detected by color flow Doppler.  LEFT VENTRICLE PLAX 2D LVIDd:         4.30 cm   Diastology LVIDs:         2.80 cm   LV e' medial:    6.49 cm/s LV PW:         0.80 cm   LV E/e' medial:  12.3 LV IVS:        1.10 cm   LV e' lateral:   9.48 cm/s LVOT diam:     1.80 cm   LV E/e' lateral: 8.4 LV SV:         50 LV SV Index:   36 LVOT Area:     2.54 cm  RIGHT VENTRICLE RV S prime:     14.20 cm/s TAPSE (M-mode): 2.5 cm LEFT ATRIUM           Index        RIGHT ATRIUM          Index LA diam:      2.60 cm 1.88 cm/m   RA Area:     9.35 cm LA Vol (A2C): 18.4 ml 13.29 ml/m  RA Volume:   14.90 ml 10.76 ml/m LA Vol (A4C): 19.5 ml 14.09 ml/m  AORTIC VALVE LVOT Vmax:   113.00 cm/s LVOT Vmean:  75.500 cm/s LVOT VTI:    0.196 m  AORTA Ao Root diam: 3.10 cm Ao Asc diam:  3.40 cm MITRAL VALVE               TRICUSPID VALVE MV Area (PHT): 3.42 cm    TR Peak grad:   23.6 mmHg MV Decel Time: 222 msec    TR Vmax:        243.00 cm/s MV E velocity: 79.70 cm/s MV A velocity: 50.60 cm/s  SHUNTS MV E/A ratio:  1.58        Systemic VTI:  0.20 m                            Systemic Diam: 1.80 cm Dorris Carnes MD Electronically signed by Dorris Carnes MD Signature Date/Time: 01/21/2021/5:15:02 PM    Final         Scheduled Meds:  aspirin EC  81 mg Oral Daily   Chlorhexidine Gluconate Cloth  6 each Topical Daily   enoxaparin (LOVENOX) injection  40 mg Subcutaneous Q24H   insulin aspart  0-6 Units Subcutaneous Q4H   mouth rinse  15 mL Mouth Rinse BID   sodium chloride flush  10-40 mL Intracatheter Q12H   Continuous Infusions:  sodium chloride 250 mL (01/22/21 0557)   cefTRIAXone (ROCEPHIN)  IV Stopped (01/21/21 1050)   magnesium sulfate bolus IVPB 2 g (01/22/21 0559)   potassium PHOSPHATE IVPB (in mmol) 45 mmol (01/22/21 0610)     LOS: 2 days     Cordelia Poche, MD [facility] 01/22/2021, 6:57 AM  If 7PM-7AM, please contact night-coverage [email or web address]

## 2021-01-23 LAB — CBC
HCT: 30.6 % — ABNORMAL LOW (ref 36.0–46.0)
Hemoglobin: 10.4 g/dL — ABNORMAL LOW (ref 12.0–15.0)
MCH: 31.5 pg (ref 26.0–34.0)
MCHC: 34 g/dL (ref 30.0–36.0)
MCV: 92.7 fL (ref 80.0–100.0)
Platelets: 145 10*3/uL — ABNORMAL LOW (ref 150–400)
RBC: 3.3 MIL/uL — ABNORMAL LOW (ref 3.87–5.11)
RDW: 13.7 % (ref 11.5–15.5)
WBC: 21.1 10*3/uL — ABNORMAL HIGH (ref 4.0–10.5)
nRBC: 0 % (ref 0.0–0.2)

## 2021-01-23 LAB — BASIC METABOLIC PANEL
Anion gap: 6 (ref 5–15)
BUN: 7 mg/dL — ABNORMAL LOW (ref 8–23)
CO2: 26 mmol/L (ref 22–32)
Calcium: 7.9 mg/dL — ABNORMAL LOW (ref 8.9–10.3)
Chloride: 107 mmol/L (ref 98–111)
Creatinine, Ser: 0.34 mg/dL — ABNORMAL LOW (ref 0.44–1.00)
GFR, Estimated: >60 mL/min (ref >60)
Glucose, Bld: 84 mg/dL (ref 70–99)
Potassium: 3.9 mmol/L (ref 3.5–5.1)
Sodium: 139 mmol/L (ref 135–145)

## 2021-01-23 LAB — MAGNESIUM: Magnesium: 2 mg/dL (ref 1.7–2.4)

## 2021-01-23 LAB — CALCIUM, IONIZED: Calcium, Ionized, Serum: 4.8 mg/dL (ref 4.5–5.6)

## 2021-01-23 LAB — PHOSPHORUS: Phosphorus: 2 mg/dL — ABNORMAL LOW (ref 2.5–4.6)

## 2021-01-23 MED ORDER — K PHOS MONO-SOD PHOS DI & MONO 155-852-130 MG PO TABS
500.0000 mg | ORAL_TABLET | Freq: Three times a day (TID) | ORAL | Status: AC
  Administered 2021-01-23 – 2021-01-24 (×3): 500 mg via ORAL
  Filled 2021-01-23 (×3): qty 2

## 2021-01-23 NOTE — Care Management Important Message (Signed)
Important Message  Patient Details IM Letter placed in Patients room. Name: Deborah Glass MRN: [id number] Date of Birth: September 08, 1950   Medicare Important Message Given:  Yes     Caren Macadam 01/23/2021, 10:59 AM

## 2021-01-23 NOTE — Progress Notes (Addendum)
Progress Note    Deborah Glass  MWN:[id number] DOB: 1950-08-15  DOA: 01/20/2021 PCP: Etta Grandchild, MD      Brief Narrative:    Medical records reviewed and are as summarized below:  Deborah Glass is a 70 y.o. female with medical history significant for vitamin D deficiency, hyperlipidemia, who presented to the hospital with fever and abdominal pain.  She was found to have right ureteral stone complicated by right hydroureteronephrosis, severe sepsis/septic shock secondary to acute UTI.  Right ureteral stent was placed by urologist on 01/20/2021      Assessment/Plan:   Principal Problem:   Septic shock (HCC) Active Problems:   Sepsis (HCC)   Dyspnea   Hydronephrosis   Kidney stone   Acute pyelonephritis   E. coli bacteremia   Body mass index is 23.38 kg/m.   Severe sepsis/septic shock secondary to acute complicated E. coli UTI and E. coli bacteremia, persistent leukocytosis: Urine and blood cultures showed E. coli.  Continue IV cefazolin.  Right ureteral stone, right hydroureteronephrosis: S/p right ureteral stent on 01/20/2021.  Outpatient follow-up with urologist.  Elevated troponin: This is likely from demand ischemia.  2D echo showed normal EF  Hypophosphatemia: Improving.  Replete with potassium phosphate.  Acute thrombocytopenia: Likely from sepsis.  Improving.  Hypokalemia, non-anion gap metabolic acidosis: Resolved     Diet Order             Diet clear liquid Room service appropriate? Yes; Fluid consistency: Thin  Diet effective now                      Consultants: Urologist Intensivist  Procedures: Right ureteral stent on 01/20/2021    Medications:    aspirin EC  81 mg Oral Daily   Chlorhexidine Gluconate Cloth  6 each Topical Daily   enoxaparin (LOVENOX) injection  40 mg Subcutaneous Q24H   mouth rinse  15 mL Mouth Rinse BID   Continuous Infusions:  sodium chloride Stopped (01/22/21 1641)    ceFAZolin (ANCEF)  IV Stopped (01/23/21 0549)     Anti-infectives (From admission, onward)    Start     Dose/Rate Route Frequency Ordered Stop   01/22/21 1400  ceFAZolin (ANCEF) IVPB 2g/100 mL premix        2 g 200 mL/hr over 30 Minutes Intravenous Every 8 hours 01/22/21 1123     01/21/21 1000  vancomycin (VANCOREADY) IVPB 500 mg/100 mL  Status:  Discontinued        500 mg 100 mL/hr over 60 Minutes Intravenous Every 24 hours 01/20/21 1118 01/20/21 2312   01/21/21 1000  cefTRIAXone (ROCEPHIN) 2 g in sodium chloride 0.9 % 100 mL IVPB  Status:  Discontinued        2 g 200 mL/hr over 30 Minutes Intravenous Every 24 hours 01/20/21 2312 01/22/21 1123   01/20/21 2100  ceFEPIme (MAXIPIME) 2 g in sodium chloride 0.9 % 100 mL IVPB  Status:  Discontinued        2 g 200 mL/hr over 30 Minutes Intravenous Every 12 hours 01/20/21 1118 01/20/21 2312   01/20/21 0830  ceFEPIme (MAXIPIME) 2 g in sodium chloride 0.9 % 100 mL IVPB        2 g 200 mL/hr over 30 Minutes Intravenous  Once 01/20/21 0819 01/20/21 1111   01/20/21 0830  metroNIDAZOLE (FLAGYL) IVPB 500 mg        500 mg 100 mL/hr over 60 Minutes Intravenous  Once  01/20/21 0819 01/20/21 1112   01/20/21 0830  vancomycin (VANCOCIN) IVPB 1000 mg/200 mL premix        1,000 mg 200 mL/hr over 60 Minutes Intravenous  Once 01/20/21 1610 01/20/21 1112              Family Communication/Anticipated D/C date and plan/Code Status   DVT prophylaxis: enoxaparin (LOVENOX) injection 40 mg Start: 01/21/21 1000     Code Status: Full Code  Family Communication: None Disposition Plan: Possible discharge home in 1 to 2 days   Status is: Inpatient  Remains inpatient appropriate because: Requiring IV antibiotics           Subjective:   c/o nausea. She wants to go home  Objective:    Vitals:   01/22/21 1718 01/22/21 2120 01/23/21 0121 01/23/21 0506  BP: (!) 143/86 (!) 140/98 (!) 147/86 137/79  Pulse: 77 76 92 91  Resp: Temp: (!) 97.5 F  (36.4 C) 98.1 F (36.7 C) 98.4 F (36.9 C) 98.2 F (36.8 C)  TempSrc: Oral Oral Oral Oral  SpO2: 96% 97% 93% 92%  Weight:      Height:       No data found.   Intake/Output Summary (Last 24 hours) at 01/23/2021 0952 Last data filed at 01/23/2021 0603 Gross per 24 hour  Intake 758.43 ml  Output 3400 ml  Net -2641.57 ml   Filed Weights   01/20/21 0815  Weight: 49 kg    Exam:  GEN: NAD SKIN: No rash EYES: EOMI ENT: MMM CV: RRR PULM: CTA B ABD: soft, ND, mild left lower quadrant tenderness, +BS CNS: AAO x 3, non focal EXT: No edema or tenderness        Data Reviewed:   I have personally reviewed following labs and imaging studies:  Labs: Labs show the following:   Basic Metabolic Panel: Recent Labs  Lab 01/20/21 0157 01/20/21 0810 01/20/21 2159 01/21/21 0500 01/22/21 0249 01/22/21 1952 01/23/21 0518  NA 135  --  139 137 136  --  139  K 3.4*  --  3.7 4.3 3.6  --  3.9  CL 103  --  116* 113* 109  --  107  CO2 25  --  17* 16* 19*  --  26  GLUCOSE 160*  --  126* 87 75  --  84  BUN 20  --  --  7*  CREATININE 0.89  --  0.87 0.71 0.58  --  0.34*  CALCIUM 8.8*  --  6.9* 6.9* 7.5*  --  7.9*  MG  --  2.2  --   --  1.7  --  2.0  PHOS  --   --   --   --  1.2* 1.7* 2.0*   GFR Estimated Creatinine Clearance: 42.9 mL/min (A) (by C-G formula based on SCr of 0.34 mg/dL (L)). Liver Function Tests: Recent Labs  Lab 01/20/21 0157 01/21/21 0500 01/22/21 0249  AST ALT ALKPHOS 64 46 56  BILITOT 0.5 0.4 0.5  PROT 7.7 5.0* 5.2*  ALBUMIN 4.2 2.4* 2.4*   Recent Labs  Lab 01/20/21 0157  LIPASE 43   No results for input(s): AMMONIA in the last 168 hours. Coagulation profile Recent Labs  Lab 01/21/21 0530  INR 1.6*    CBC: Recent Labs  Lab 01/20/21 0157 01/21/21 0500 01/22/21 0249 01/23/21 0518  WBC 10.3 16.8* 20.1* 21.1*  NEUTROABS  9.2*  --   --   --   HGB 12.2 9.8* 10.0* 10.4*  HCT 36.5 30.0* 30.8* 30.6*  MCV 94.6  96.2 97.2 92.7  PLT 228 120* 125* 145*   Cardiac Enzymes: No results for input(s): CKTOTAL, CKMB, CKMBINDEX, TROPONINI in the last 168 hours. BNP (last 3 results) No results for input(s): PROBNP in the last 8760 hours. CBG: Recent Labs  Lab 01/21/21 1945 01/21/21 2007 01/21/21 2339 01/22/21 0357 01/22/21 0801  GLUCAP 64* 91 79 99 82   D-Dimer: No results for input(s): DDIMER in the last 72 hours. Hgb A1c: No results for input(s): HGBA1C in the last 72 hours. Lipid Profile: No results for input(s): CHOL, HDL, LDLCALC, TRIG, CHOLHDL, LDLDIRECT in the last 72 hours. Thyroid function studies: No results for input(s): TSH, T4TOTAL, T3FREE, THYROIDAB in the last 72 hours.  Invalid input(s): FREET3 Anemia work up: No results for input(s): VITAMINB12, FOLATE, FERRITIN, TIBC, IRON, RETICCTPCT in the last 72 hours. Sepsis Labs: Recent Labs  Lab 01/20/21 0157 01/20/21 0810 01/20/21 1425 01/20/21 2149 01/21/21 0500 01/22/21 0249 01/23/21 0518  PROCALCITON  --   --   --   --  37.79 21.86  --   WBC 10.3  --   --   --  16.8* 20.1* 21.1*  LATICACIDVEN  --  1.9 1.9 1.6 1.4  --   --     Microbiology Recent Results (from the past 240 hour(s))  Resp Panel by RT-PCR (Flu A&B, Covid) Nasopharyngeal Swab     Status: None   Collection Time: 01/20/21  5:15 AM   Specimen: Nasopharyngeal Swab; Nasopharyngeal(NP) swabs in vial transport medium  Result Value Ref Range Status   SARS Coronavirus 2 by RT PCR NEGATIVE NEGATIVE Final    Comment: (NOTE) SARS-CoV-2 target nucleic acids are NOT DETECTED.  The SARS-CoV-2 RNA is generally detectable in upper respiratory specimens during the acute phase of infection. The lowest concentration of SARS-CoV-2 viral copies this assay can detect is 138 copies/mL. A negative result does not preclude SARS-Cov-2 infection and should not be used as the sole basis for treatment or other patient management decisions. A negative result may occur with   improper specimen collection/handling, submission of specimen other than nasopharyngeal swab, presence of viral mutation(s) within the areas targeted by this assay, and inadequate number of viral copies(<138 copies/mL). A negative result must be combined with clinical observations, patient history, and epidemiological information. The expected result is Negative.  Fact Sheet for Patients:  BloggerCourse.com  Fact Sheet for Healthcare Providers:  SeriousBroker.it  This test is no t yet approved or cleared by the [place] FDA and  has been authorized for detection and/or diagnosis of SARS-CoV-2 by FDA under an Emergency Use Authorization (EUA). This EUA will remain  in effect (meaning this test can be used) for the duration of the COVID-19 declaration under Section 564(b)(1) of the Act, 21 U.S.C.section 360bbb-3(b)(1), unless the authorization is terminated  or revoked sooner.       Influenza A by PCR NEGATIVE NEGATIVE Final   Influenza B by PCR NEGATIVE NEGATIVE Final    Comment: (NOTE) The Xpert Xpress SARS-CoV-2/FLU/RSV plus assay is intended as an aid in the diagnosis of influenza from Nasopharyngeal swab specimens and should not be used as a sole basis for treatment. Nasal washings and aspirates are unacceptable for Xpert Xpress SARS-CoV-2/FLU/RSV testing.  Fact Sheet for Patients: BloggerCourse.com  Fact Sheet for Healthcare Providers: SeriousBroker.it  This test is not yet approved or cleared  by the [place] and has been authorized for detection and/or diagnosis of SARS-CoV-2 by FDA under an Emergency Use Authorization (EUA). This EUA will remain in effect (meaning this test can be used) for the duration of the COVID-19 declaration under Section 564(b)(1) of the Act, 21 U.S.C. section 360bbb-3(b)(1), unless the authorization is terminated  or revoked.  Performed at [facility], [address], [place], [place] [address]   Blood culture (routine x 2)     Status: Abnormal   Collection Time: 01/20/21  8:19 AM   Specimen: BLOOD LEFT HAND  Result Value Ref Range Status   Specimen Description   Final    BLOOD LEFT HAND Performed at [facility], [address], [place], [place] [address]    Special Requests   Final    BOTTLES DRAWN AEROBIC AND ANAEROBIC Blood Culture adequate volume Performed at [facility], [address], [place], [place] [address]    Culture  Setup Time   Final    GRAM NEGATIVE RODS IN BOTH AEROBIC AND ANAEROBIC BOTTLES CRITICAL RESULT CALLED TO, READ BACK BY AND VERIFIED WITH: PHARMD MICHELLE LILLISTON 01/20/2021 2249 BY JW Performed at [facility], [address] [place], [place] [address]    Culture ESCHERICHIA COLI (A)  Final   Report Status 01/22/2021 FINAL  Final   Organism ID, Bacteria ESCHERICHIA COLI  Final      Susceptibility   Escherichia coli - MIC*    AMPICILLIN >=32 RESISTANT Resistant     CEFAZOLIN <=4 SENSITIVE Sensitive     CEFEPIME <=0.12 SENSITIVE Sensitive     CEFTAZIDIME <=1 SENSITIVE Sensitive     CEFTRIAXONE <=0.25 SENSITIVE Sensitive     CIPROFLOXACIN <=0.25 SENSITIVE Sensitive     GENTAMICIN <=1 SENSITIVE Sensitive     IMIPENEM <=0.25 SENSITIVE Sensitive     TRIMETH/SULFA <=20 SENSITIVE Sensitive     AMPICILLIN/SULBACTAM 4 SENSITIVE Sensitive     PIP/TAZO <=4 SENSITIVE Sensitive     * ESCHERICHIA COLI  Blood Culture ID Panel (Reflexed)     Status: Abnormal   Collection Time: 01/20/21  8:19 AM  Result Value Ref Range Status   Enterococcus faecalis NOT DETECTED NOT DETECTED Final   Enterococcus Faecium NOT DETECTED NOT DETECTED Final   Listeria monocytogenes NOT DETECTED NOT DETECTED Final   Staphylococcus species NOT DETECTED NOT DETECTED Final   Staphylococcus aureus (BCID) NOT DETECTED NOT DETECTED  Final   Staphylococcus epidermidis NOT DETECTED NOT DETECTED Final   Staphylococcus lugdunensis NOT DETECTED NOT DETECTED Final   Streptococcus species NOT DETECTED NOT DETECTED Final   Streptococcus agalactiae NOT DETECTED NOT DETECTED Final   Streptococcus pneumoniae NOT DETECTED NOT DETECTED Final   Streptococcus pyogenes NOT DETECTED NOT DETECTED Final   A.calcoaceticus-baumannii NOT DETECTED NOT DETECTED Final   Bacteroides fragilis NOT DETECTED NOT DETECTED Final   Enterobacterales DETECTED (A) NOT DETECTED Final    Comment: Enterobacterales represent a large order of gram negative bacteria, not a single organism. CRITICAL RESULT CALLED TO, READ BACK BY AND VERIFIED WITH: PHARMD MICHELLE LILLISTON 01/20/2021 2249 BY JW    Enterobacter cloacae complex NOT DETECTED NOT DETECTED Final   Escherichia coli DETECTED (A) NOT DETECTED Final    Comment: CRITICAL RESULT CALLED TO, READ BACK BY AND VERIFIED WITH: PHARMD MICHELLE LILLISTON 01/20/2021 2249 BY JW    Klebsiella aerogenes NOT DETECTED NOT DETECTED Final   Klebsiella oxytoca NOT DETECTED NOT DETECTED Final   Klebsiella pneumoniae  NOT DETECTED NOT DETECTED Final   Proteus species NOT DETECTED NOT DETECTED Final   Salmonella species NOT DETECTED NOT DETECTED Final   Serratia marcescens NOT DETECTED NOT DETECTED Final   Haemophilus influenzae NOT DETECTED NOT DETECTED Final   Neisseria meningitidis NOT DETECTED NOT DETECTED Final   Pseudomonas aeruginosa NOT DETECTED NOT DETECTED Final   Stenotrophomonas maltophilia NOT DETECTED NOT DETECTED Final   Candida albicans NOT DETECTED NOT DETECTED Final   Candida auris NOT DETECTED NOT DETECTED Final   Candida glabrata NOT DETECTED NOT DETECTED Final   Candida krusei NOT DETECTED NOT DETECTED Final   Candida parapsilosis NOT DETECTED NOT DETECTED Final   Candida tropicalis NOT DETECTED NOT DETECTED Final   Cryptococcus neoformans/gattii NOT DETECTED NOT DETECTED Final   CTX-M ESBL NOT  DETECTED NOT DETECTED Final   Carbapenem resistance IMP NOT DETECTED NOT DETECTED Final   Carbapenem resistance KPC NOT DETECTED NOT DETECTED Final   Carbapenem resistance NDM NOT DETECTED NOT DETECTED Final   Carbapenem resist OXA 48 LIKE NOT DETECTED NOT DETECTED Final   Carbapenem resistance VIM NOT DETECTED NOT DETECTED Final    Comment: Performed at [facility], [address], [place], [place] [address]  Urine Culture     Status: Abnormal   Collection Time: 01/20/21  8:20 AM   Specimen: Urine, Clean Catch  Result Value Ref Range Status   Specimen Description   Final    URINE, CLEAN CATCH Performed at [facility], [address] [place], [place] [address]    Special Requests   Final    NONE Performed at [facility], [address], [place], [place] [address]    Culture 40,000 COLONIES/mL ESCHERICHIA COLI (A)  Final   Report Status 01/22/2021 FINAL  Final   Organism ID, Bacteria ESCHERICHIA COLI (A)  Final      Susceptibility   Escherichia coli - MIC*    AMPICILLIN >=32 RESISTANT Resistant     CEFAZOLIN <=4 SENSITIVE Sensitive     CEFEPIME <=0.12 SENSITIVE Sensitive     CEFTRIAXONE <=0.25 SENSITIVE Sensitive     CIPROFLOXACIN <=0.25 SENSITIVE Sensitive     GENTAMICIN <=1 SENSITIVE Sensitive     IMIPENEM <=0.25 SENSITIVE Sensitive     NITROFURANTOIN <=16 SENSITIVE Sensitive     TRIMETH/SULFA >=320 RESISTANT Resistant     AMPICILLIN/SULBACTAM 8 SENSITIVE Sensitive     PIP/TAZO <=4 SENSITIVE Sensitive     * 40,000 COLONIES/mL ESCHERICHIA COLI  Blood culture (routine x 2)     Status: None (Preliminary result)   Collection Time: 01/20/21  8:24 AM   Specimen: BLOOD  Result Value Ref Range Status   Specimen Description   Final    BLOOD RIGHT ANTECUBITAL Performed at [facility], [address] [place], [place] [address]    Special Requests   Final    BOTTLES DRAWN AEROBIC AND ANAEROBIC BACTERIAL  CASTS Performed at [facility], [address], [place], [place] [address]    Culture  Setup Time   Final    GRAM NEGATIVE RODS ANAEROBIC BOTTLE ONLY CRITICAL VALUE NOTED.  VALUE IS CONSISTENT WITH PREVIOUSLY REPORTED AND CALLED VALUE.    Culture   Final    NO GROWTH 3 DAYS Performed at [facility], [address], [place], [place] [address]    Report Status PENDING  Incomplete  MRSA Next Gen by PCR, Nasal     Status: None   Collection  Time: 01/20/21  2:25 PM   Specimen: Nasal Mucosa; Nasal Swab  Result Value Ref Range Status   MRSA by PCR Next Gen NOT DETECTED NOT DETECTED Final    Comment: (NOTE) The GeneXpert MRSA Assay (FDA approved for NASAL specimens only), is one component of a comprehensive MRSA colonization surveillance program. It is not intended to diagnose MRSA infection nor to guide or monitor treatment for MRSA infections. Test performance is not FDA approved in patients less than 63 years old. Performed at [facility], [address] [place], [place] [address]   MRSA Next Gen by PCR, Nasal     Status: None   Collection Time: 01/20/21  9:04 PM   Specimen: Nasal Mucosa; Nasal Swab  Result Value Ref Range Status   MRSA by PCR Next Gen NOT DETECTED NOT DETECTED Final    Comment: (NOTE) The GeneXpert MRSA Assay (FDA approved for NASAL specimens only), is one component of a comprehensive MRSA colonization surveillance program. It is not intended to diagnose MRSA infection nor to guide or monitor treatment for MRSA infections. Test performance is not FDA approved in patients less than 88 years old. Performed at [facility], [address], [place], [place] [address]     Procedures and diagnostic studies:  ECHOCARDIOGRAM COMPLETE  Result Date: 01/21/2021    ECHOCARDIOGRAM REPORT   Patient Name:   Aayat THI Dominey Date of Exam: 01/21/2021 Medical Rec #:  [id number]       Height:       57.0 in  Accession #:    [id number]      Weight:       108.0 lb Date of Birth:  1950-06-11       BSA:          1.384 m Patient Age:    70 years        BP:           117/60 mmHg Patient Gender: F               HR:           83 bpm. Exam Location:  Inpatient Procedure: 2D Echo, Cardiac Doppler and Color Doppler Indications:    Elevated Troponin  History:        Patient has no prior history of Echocardiogram examinations.                 Sepsis of unknown origin. Mild right hydronephrosis w/ right                 renal stone.  Sonographer:    Leta Jungling RDCS Referring Phys: [id number] Teddy Spike IMPRESSIONS  1. Left ventricular ejection fraction, by estimation, is 60 to 65%. The left ventricle has normal function. The left ventricle has no regional wall motion abnormalities. Left ventricular diastolic parameters were normal.  2. Right ventricular systolic function is normal. The right ventricular size is normal.  3. The mitral valve is normal in structure. Trivial mitral valve regurgitation.  4. The aortic valve is tricuspid.  5. The inferior vena cava is normal in size with greater than 50% respiratory variability, suggesting right atrial pressure of 3 mmHg. FINDINGS  Left Ventricle: Left ventricular ejection fraction, by estimation, is 60 to 65%. The left ventricle has normal function. The left ventricle has no regional wall motion abnormalities. The left ventricular internal cavity size was normal in size. There is  no left ventricular hypertrophy. Left ventricular diastolic parameters  were normal. Right Ventricle: The right ventricular size is normal. Right vetricular wall thickness was not assessed. Right ventricular systolic function is normal. Left Atrium: Left atrial size was normal in size. Right Atrium: Right atrial size was normal in size. Pericardium: There is no evidence of pericardial effusion. Mitral Valve: The mitral valve is normal in structure. Trivial mitral valve regurgitation. Tricuspid Valve: The  tricuspid valve is normal in structure. Tricuspid valve regurgitation is mild. Aortic Valve: The aortic valve is tricuspid. Pulmonic Valve: The pulmonic valve was not well visualized. Pulmonic valve regurgitation is not visualized. Aorta: The aortic root and ascending aorta are structurally normal, with no evidence of dilitation. Venous: The inferior vena cava is normal in size with greater than 50% respiratory variability, suggesting right atrial pressure of 3 mmHg. IAS/Shunts: No atrial level shunt detected by color flow Doppler.  LEFT VENTRICLE PLAX 2D LVIDd:         4.30 cm   Diastology LVIDs:         2.80 cm   LV e' medial:    6.49 cm/s LV PW:         0.80 cm   LV E/e' medial:  12.3 LV IVS:        1.10 cm   LV e' lateral:   9.48 cm/s LVOT diam:     1.80 cm   LV E/e' lateral: 8.4 LV SV:         50 LV SV Index:   36 LVOT Area:     2.54 cm  RIGHT VENTRICLE RV S prime:     14.20 cm/s TAPSE (M-mode): 2.5 cm LEFT ATRIUM           Index        RIGHT ATRIUM          Index LA diam:      2.60 cm 1.88 cm/m   RA Area:     9.35 cm LA Vol (A2C): 18.4 ml 13.29 ml/m  RA Volume:   14.90 ml 10.76 ml/m LA Vol (A4C): 19.5 ml 14.09 ml/m  AORTIC VALVE LVOT Vmax:   113.00 cm/s LVOT Vmean:  75.500 cm/s LVOT VTI:    0.196 m  AORTA Ao Root diam: 3.10 cm Ao Asc diam:  3.40 cm MITRAL VALVE               TRICUSPID VALVE MV Area (PHT): 3.42 cm    TR Peak grad:   23.6 mmHg MV Decel Time: 222 msec    TR Vmax:        243.00 cm/s MV E velocity: 79.70 cm/s MV A velocity: 50.60 cm/s  SHUNTS MV E/A ratio:  1.58        Systemic VTI:  0.20 m                            Systemic Diam: 1.80 cm Dietrich Pates MD Electronically signed by Dietrich Pates MD Signature Date/Time: 01/21/2021/5:15:02 PM    Final                LOS: 3 days   Koralynn Greenspan  [facility]   Pager on [email or web address]. If 7PM-7AM, please contact night-coverage at [email or web address]     01/23/2021, 9:52 AM

## 2021-01-24 LAB — BASIC METABOLIC PANEL
Anion gap: 7 (ref 5–15)
BUN: 7 mg/dL — ABNORMAL LOW (ref 8–23)
CO2: 27 mmol/L (ref 22–32)
Calcium: 8.1 mg/dL — ABNORMAL LOW (ref 8.9–10.3)
Chloride: 104 mmol/L (ref 98–111)
Creatinine, Ser: 0.51 mg/dL (ref 0.44–1.00)
GFR, Estimated: >60 mL/min (ref >60)
Glucose, Bld: 99 mg/dL (ref 70–99)
Potassium: 3.7 mmol/L (ref 3.5–5.1)
Sodium: 138 mmol/L (ref 135–145)

## 2021-01-24 LAB — CBC WITH DIFFERENTIAL/PLATELET
Abs Immature Granulocytes: 0.38 10*3/uL — ABNORMAL HIGH (ref 0.00–0.07)
Basophils Absolute: 0.1 10*3/uL (ref 0.0–0.1)
Basophils Relative: 1 %
Eosinophils Absolute: 0.4 10*3/uL (ref 0.0–0.5)
Eosinophils Relative: 3 %
HCT: 32.5 % — ABNORMAL LOW (ref 36.0–46.0)
Hemoglobin: 11 g/dL — ABNORMAL LOW (ref 12.0–15.0)
Immature Granulocytes: 3 %
Lymphocytes Relative: 14 %
Lymphs Abs: 1.9 10*3/uL (ref 0.7–4.0)
MCH: 31.2 pg (ref 26.0–34.0)
MCHC: 33.8 g/dL (ref 30.0–36.0)
MCV: 92.1 fL (ref 80.0–100.0)
Monocytes Absolute: 1.2 10*3/uL — ABNORMAL HIGH (ref 0.1–1.0)
Monocytes Relative: 9 %
Neutro Abs: 9.5 10*3/uL — ABNORMAL HIGH (ref 1.7–7.7)
Neutrophils Relative %: 70 %
Platelets: 163 10*3/uL (ref 150–400)
RBC: 3.53 MIL/uL — ABNORMAL LOW (ref 3.87–5.11)
RDW: 13.6 % (ref 11.5–15.5)
WBC: 13.4 10*3/uL — ABNORMAL HIGH (ref 4.0–10.5)
nRBC: 0 % (ref 0.0–0.2)

## 2021-01-24 LAB — PHOSPHORUS: Phosphorus: 4.4 mg/dL (ref 2.5–4.6)

## 2021-01-24 MED ORDER — LEVOFLOXACIN 500 MG PO TABS: 500.0000 mg | ORAL_TABLET | Freq: Every day | ORAL | 0 refills | Status: AC

## 2021-01-24 NOTE — Discharge Instructions (Signed)
You have a plastic tube in your kidney that will need to be removed next week.  Please contact my office (Dr. Bjorn Pippin) [phone] to get an appointment set up to have this done.

## 2021-01-24 NOTE — Discharge Summary (Addendum)
Physician Discharge Summary  Deborah Glass NFA:[id number] DOB: 1950/12/02 DOA: 01/20/2021  PCP: Etta Grandchild, MD  Admit date: 01/20/2021 Discharge date: 01/24/2021  Discharge disposition: Home   Recommendations for Outpatient Follow-Up:   Follow-up with Dr. Annabell Howells, urologist, on 01/27/2021. Follow-up with PCP 1 to 2 weeks.   Discharge Diagnosis:   Principal Problem:   Septic shock (HCC) Active Problems:   Sepsis (HCC)   Dyspnea   Hydronephrosis   Kidney stone   Acute pyelonephritis   E. coli bacteremia    Discharge Condition: Stable.  Diet recommendation:  Diet Order             Diet - low sodium heart healthy           Diet clear liquid Room service appropriate? Yes; Fluid consistency: Thin  Diet effective now                     Code Status: Full Code     Hospital Course:   Deborah Glass is a 70 y.o. female with medical history significant for vitamin D deficiency, hyperlipidemia, who presented to the hospital with fever and abdominal pain.  She was found to have right ureteral stone complicated by right hydroureteronephrosis, severe sepsis/septic shock secondary to acute UTI.  Right ureteral stent was placed by urologist on 01/20/2021.  She was treated with IV fluids and empiric IV antibiotics.  Urine and blood culture showed E. coli.  Her condition improved and she was deemed stable for discharge to home today. Case was discussed with Dr. Annabell Howells, urologist, (via secure chat) who recommended removing Foley catheter prior to discharge.   Medical Consultants:   Urologist   Discharge Exam:    Vitals:   01/23/21 0506 01/23/21 1449 01/23/21 2206 01/24/21 0524  BP: 137/79 (!) 153/92 (!) 148/82 125/79  Pulse: 91 82 81 82  Resp: Temp: 98.2 F (36.8 C) 98 F (36.7 C) 98 F (36.7 C) 98.8 F (37.1 C)  TempSrc: Oral Oral Oral Oral  SpO2: 92% 98% 92% 91%  Weight:      Height:         GEN: NAD SKIN: No rash exposed  skin EYES: No pallor or icterus ENT: MMM CV: RRR PULM: CTA B ABD: soft, ND, NT, +BS CNS: AAO x 3, non focal EXT: No edema or tenderness GU: Foley catheter draining amber urine   The results of significant diagnostics from this hospitalization (including imaging, microbiology, ancillary and laboratory) are listed below for reference.     Procedures and Diagnostic Studies:   CT ABDOMEN PELVIS W CONTRAST  Result Date: 01/20/2021 CLINICAL DATA:  Abdominal pain, nausea, bowel obstruction suspected EXAM: CT ABDOMEN AND PELVIS WITH CONTRAST TECHNIQUE: Multidetector CT imaging of the abdomen and pelvis was performed using the standard protocol following bolus administration of intravenous contrast. CONTRAST:  75mL OMNIPAQUE IOHEXOL 350 MG/ML SOLN COMPARISON:  None. FINDINGS: Lower chest: Lung bases are clear. Hepatobiliary: Liver is within normal limits. Gallbladder is unremarkable. No intrahepatic or extrahepatic ductal dilatation Pancreas: Within normal limits. Spleen: Within normal limits. Adrenals/Urinary Tract: Adrenal glands are within normal limits. Left kidney is within normal limits. Right kidney is notable for perirenal edema with perinephric fluid and mild right hydroureteronephrosis. Associated 2 mm distal right ureteral calculus at the UVJ (series 2/image 69). Bladder is within normal limits. Stomach/Bowel: Stomach is within normal limits. No evidence of bowel obstruction. Normal appendix (series 2/image 58). No colonic  wall thickening or inflammatory changes. Vascular/Lymphatic: No evidence of abdominal aortic aneurysm. Atherosclerotic calcifications of the abdominal aorta and branch vessels. No suspicious abdominopelvic lymphadenopathy. Reproductive: Uterus is within normal limits. Bilateral ovaries are within normal limits. Other: No abdominopelvic ascites. Musculoskeletal: Mild degenerative changes of the lower lumbar spine. IMPRESSION: 2 mm distal right ureteral calculus at the UVJ.  Associated mild right hydroureteronephrosis. No evidence of bowel obstruction. Electronically Signed   By: Charline Bills M.D.   On: 01/20/2021 03:54   US RENAL  Result Date: 01/20/2021 CLINICAL DATA:  Hydronephrosis. EXAM: RENAL / URINARY TRACT ULTRASOUND COMPLETE COMPARISON:  CT abdomen pelvis 01/20/2021. FINDINGS: Right Kidney: Renal measurements: 10.1 x 4.9 x 5.3 cm = volume: 139 mL. Echogenicity within normal limits. No mass. Mild hydronephrosis visualized. Left Kidney: Renal measurements: 10.3 x 5.5 x 4.9 cm = volume: 143 mL. Echogenicity within normal limits. No mass or hydronephrosis visualized. Urinary bladder: Appears normal for degree of bladder distention. Other: None. IMPRESSION: Mild right hydronephrosis. Electronically Signed   By: Tish Frederickson M.D.   On: 01/20/2021 15:56   DG Chest Port 1 View  Result Date: 01/21/2021 CLINICAL DATA:  Shortness of breath EXAM: PORTABLE CHEST 1 VIEW COMPARISON:  Previous studies including the examination of 01/20/2021 FINDINGS: Cardiac size is within normal limits. There are no signs of pulmonary edema. Increased markings are seen in the medial left lower lung fields with no significant interval change. There is minimal blunting of left lateral CP angle. There is no pneumothorax. Tip of central venous catheter is seen in superior vena cava. IMPRESSION: Increased markings in the medial left lower lung fields may suggest crowding of bronchovascular structures or subsegmental atelectasis. Electronically Signed   By: Ernie Avena M.D.   On: 01/21/2021 08:05   DG Chest Port 1 View  Result Date: 01/20/2021 CLINICAL DATA:  Central line placement. EXAM: PORTABLE CHEST 1 VIEW COMPARISON:  Chest x-ray 12/23/2020. FINDINGS: There is a new right-sided central venous catheter with distal tip projecting over the mid SVC. The heart size and mediastinal contours are within normal limits. Both lungs are clear. The visualized skeletal structures are  unremarkable. IMPRESSION: No active disease. Electronically Signed   By: Darliss Cheney M.D.   On: 01/20/2021 22:25   DG Chest Port 1 View  Result Date: 01/20/2021 CLINICAL DATA:  Fever EXAM: PORTABLE CHEST 1 VIEW COMPARISON:  11/01/2019 FINDINGS: Cardiac size is unremarkable. Central pulmonary vessels are prominent. There is poor inspiration. Interstitial markings in the parahilar regions and lower lung fields are prominent. There is no focal consolidation. There is no pleural effusion or pneumothorax. IMPRESSION: Central pulmonary vessels are prominent without signs of alveolar pulmonary edema. There is prominence of interstitial markings in the parahilar regions and lower lung fields suggesting bronchitis or interstitial pneumonitis. Electronically Signed   By: Ernie Avena M.D.   On: 01/20/2021 08:39   DG C-Arm 1-60 Min  Result Date: 01/20/2021 CLINICAL DATA:  Cystoscopy with stent replacement, right. EXAM: DG C-ARM 1-60 MIN FLUOROSCOPY TIME:  Fluoroscopy Time:  1 minute 54 seconds Radiation Exposure Index (if provided by the fluoroscopic device): Not provided. Number of Acquired Spot Images: 4 COMPARISON:  Abdomen pelvis CT earlier this day. FINDINGS: Four fluoroscopic spot views obtained during cystoscopy and right ureteral stent placement. Contrast ejected into the right ureter demonstrates dilatation of the renal collecting system and ureter. The stone on prior CT is not well seen on provided views. Ureteral stent is present on the final images. IMPRESSION: Procedural  fluoroscopy during cystoscopy and right ureteral stent placement. Correlate with operative report for details. Electronically Signed   By: Narda Rutherford M.D.   On: 01/20/2021 20:13   ECHOCARDIOGRAM COMPLETE  Result Date: 01/21/2021    ECHOCARDIOGRAM REPORT   Patient Name:   Andee THI Vandiver Date of Exam: 01/21/2021 Medical Rec #:  [id number]       Height:       57.0 in Accession #:    [id number]      Weight:       108.0 lb  Date of Birth:  1950-05-03       BSA:          1.384 m Patient Age:    70 years        BP:           117/60 mmHg Patient Gender: F               HR:           83 bpm. Exam Location:  Inpatient Procedure: 2D Echo, Cardiac Doppler and Color Doppler Indications:    Elevated Troponin  History:        Patient has no prior history of Echocardiogram examinations.                 Sepsis of unknown origin. Mild right hydronephrosis w/ right                 renal stone.  Sonographer:    Leta Jungling RDCS Referring Phys: [id number] Teddy Spike IMPRESSIONS  1. Left ventricular ejection fraction, by estimation, is 60 to 65%. The left ventricle has normal function. The left ventricle has no regional wall motion abnormalities. Left ventricular diastolic parameters were normal.  2. Right ventricular systolic function is normal. The right ventricular size is normal.  3. The mitral valve is normal in structure. Trivial mitral valve regurgitation.  4. The aortic valve is tricuspid.  5. The inferior vena cava is normal in size with greater than 50% respiratory variability, suggesting right atrial pressure of 3 mmHg. FINDINGS  Left Ventricle: Left ventricular ejection fraction, by estimation, is 60 to 65%. The left ventricle has normal function. The left ventricle has no regional wall motion abnormalities. The left ventricular internal cavity size was normal in size. There is  no left ventricular hypertrophy. Left ventricular diastolic parameters were normal. Right Ventricle: The right ventricular size is normal. Right vetricular wall thickness was not assessed. Right ventricular systolic function is normal. Left Atrium: Left atrial size was normal in size. Right Atrium: Right atrial size was normal in size. Pericardium: There is no evidence of pericardial effusion. Mitral Valve: The mitral valve is normal in structure. Trivial mitral valve regurgitation. Tricuspid Valve: The tricuspid valve is normal in structure. Tricuspid valve  regurgitation is mild. Aortic Valve: The aortic valve is tricuspid. Pulmonic Valve: The pulmonic valve was not well visualized. Pulmonic valve regurgitation is not visualized. Aorta: The aortic root and ascending aorta are structurally normal, with no evidence of dilitation. Venous: The inferior vena cava is normal in size with greater than 50% respiratory variability, suggesting right atrial pressure of 3 mmHg. IAS/Shunts: No atrial level shunt detected by color flow Doppler.  LEFT VENTRICLE PLAX 2D LVIDd:         4.30 cm   Diastology LVIDs:         2.80 cm   LV e' medial:    6.49 cm/s LV PW:  0.80 cm   LV E/e' medial:  12.3 LV IVS:        1.10 cm   LV e' lateral:   9.48 cm/s LVOT diam:     1.80 cm   LV E/e' lateral: 8.4 LV SV:         50 LV SV Index:   36 LVOT Area:     2.54 cm  RIGHT VENTRICLE RV S prime:     14.20 cm/s TAPSE (M-mode): 2.5 cm LEFT ATRIUM           Index        RIGHT ATRIUM          Index LA diam:      2.60 cm 1.88 cm/m   RA Area:     9.35 cm LA Vol (A2C): 18.4 ml 13.29 ml/m  RA Volume:   14.90 ml 10.76 ml/m LA Vol (A4C): 19.5 ml 14.09 ml/m  AORTIC VALVE LVOT Vmax:   113.00 cm/s LVOT Vmean:  75.500 cm/s LVOT VTI:    0.196 m  AORTA Ao Root diam: 3.10 cm Ao Asc diam:  3.40 cm MITRAL VALVE               TRICUSPID VALVE MV Area (PHT): 3.42 cm    TR Peak grad:   23.6 mmHg MV Decel Time: 222 msec    TR Vmax:        243.00 cm/s MV E velocity: 79.70 cm/s MV A velocity: 50.60 cm/s  SHUNTS MV E/A ratio:  1.58        Systemic VTI:  0.20 m                            Systemic Diam: 1.80 cm Dietrich Pates MD Electronically signed by Dietrich Pates MD Signature Date/Time: 01/21/2021/5:15:02 PM    Final      Labs:   Basic Metabolic Panel: Recent Labs  Lab 01/20/21 0810 01/20/21 2159 01/21/21 0500 01/22/21 0249 01/22/21 1952 01/23/21 0518 01/24/21 0507  NA  --  139 137 136  --  139 138  K  --  3.7 4.3 3.6  --  3.9 3.7  CL  --  116* 113* 109  --  107 104  CO2  --  17* 16* 19*  --  26 27   GLUCOSE  --  126* 87 75  --  84 99  BUN  --  --  7* 7*  CREATININE  --  0.87 0.71 0.58  --  0.34* 0.51  CALCIUM  --  6.9* 6.9* 7.5*  --  7.9* 8.1*  MG 2.2  --   --  1.7  --  2.0  --   PHOS  --   --   --  1.2* 1.7* 2.0* 4.4   GFR Estimated Creatinine Clearance: 42.9 mL/min (by C-G formula based on SCr of 0.51 mg/dL). Liver Function Tests: Recent Labs  Lab 01/20/21 0157 01/21/21 0500 01/22/21 0249  AST ALT ALKPHOS 64 46 56  BILITOT 0.5 0.4 0.5  PROT 7.7 5.0* 5.2*  ALBUMIN 4.2 2.4* 2.4*   Recent Labs  Lab 01/20/21 0157  LIPASE 43   No results for input(s): AMMONIA in the last 168 hours. Coagulation profile Recent Labs  Lab 01/21/21 0530  INR 1.6*    CBC: Recent Labs  Lab 01/20/21 0157 01/21/21 0500 01/22/21 0249 01/23/21 0518 01/24/21  0507  WBC 10.3 16.8* 20.1* 21.1* 13.4*  NEUTROABS 9.2*  --   --   --  9.5*  HGB 12.2 9.8* 10.0* 10.4* 11.0*  HCT 36.5 30.0* 30.8* 30.6* 32.5*  MCV 94.6 96.2 97.2 92.7 92.1  PLT 228 120* 125* 145* 163   Cardiac Enzymes: No results for input(s): CKTOTAL, CKMB, CKMBINDEX, TROPONINI in the last 168 hours. BNP: Invalid input(s): POCBNP CBG: Recent Labs  Lab 01/21/21 1945 01/21/21 2007 01/21/21 2339 01/22/21 0357 01/22/21 0801  GLUCAP 64* 91 79 99 82   D-Dimer No results for input(s): DDIMER in the last 72 hours. Hgb A1c No results for input(s): HGBA1C in the last 72 hours. Lipid Profile No results for input(s): CHOL, HDL, LDLCALC, TRIG, CHOLHDL, LDLDIRECT in the last 72 hours. Thyroid function studies No results for input(s): TSH, T4TOTAL, T3FREE, THYROIDAB in the last 72 hours.  Invalid input(s): FREET3 Anemia work up No results for input(s): VITAMINB12, FOLATE, FERRITIN, TIBC, IRON, RETICCTPCT in the last 72 hours. Microbiology Recent Results (from the past 240 hour(s))  Resp Panel by RT-PCR (Flu A&B, Covid) Nasopharyngeal Swab     Status: None   Collection Time: 01/20/21  5:15 AM    Specimen: Nasopharyngeal Swab; Nasopharyngeal(NP) swabs in vial transport medium  Result Value Ref Range Status   SARS Coronavirus 2 by RT PCR NEGATIVE NEGATIVE Final    Comment: (NOTE) SARS-CoV-2 target nucleic acids are NOT DETECTED.  The SARS-CoV-2 RNA is generally detectable in upper respiratory specimens during the acute phase of infection. The lowest concentration of SARS-CoV-2 viral copies this assay can detect is 138 copies/mL. A negative result does not preclude SARS-Cov-2 infection and should not be used as the sole basis for treatment or other patient management decisions. A negative result may occur with  improper specimen collection/handling, submission of specimen other than nasopharyngeal swab, presence of viral mutation(s) within the areas targeted by this assay, and inadequate number of viral copies(<138 copies/mL). A negative result must be combined with clinical observations, patient history, and epidemiological information. The expected result is Negative.  Fact Sheet for Patients:  BloggerCourse.com  Fact Sheet for Healthcare Providers:  SeriousBroker.it  This test is no t yet approved or cleared by the [place] FDA and  has been authorized for detection and/or diagnosis of SARS-CoV-2 by FDA under an Emergency Use Authorization (EUA). This EUA will remain  in effect (meaning this test can be used) for the duration of the COVID-19 declaration under Section 564(b)(1) of the Act, 21 U.S.C.section 360bbb-3(b)(1), unless the authorization is terminated  or revoked sooner.       Influenza A by PCR NEGATIVE NEGATIVE Final   Influenza B by PCR NEGATIVE NEGATIVE Final    Comment: (NOTE) The Xpert Xpress SARS-CoV-2/FLU/RSV plus assay is intended as an aid in the diagnosis of influenza from Nasopharyngeal swab specimens and should not be used as a sole basis for treatment. Nasal washings and aspirates are  unacceptable for Xpert Xpress SARS-CoV-2/FLU/RSV testing.  Fact Sheet for Patients: BloggerCourse.com  Fact Sheet for Healthcare Providers: SeriousBroker.it  This test is not yet approved or cleared by the [place] FDA and has been authorized for detection and/or diagnosis of SARS-CoV-2 by FDA under an Emergency Use Authorization (EUA). This EUA will remain in effect (meaning this test can be used) for the duration of the COVID-19 declaration under Section 564(b)(1) of the Act, 21 U.S.C. section 360bbb-3(b)(1), unless the authorization is terminated or revoked.  Performed at [facility], [address] [place], [place] [address]   Blood culture (routine x 2)     Status: Abnormal   Collection Time: 01/20/21  8:19 AM   Specimen: BLOOD LEFT HAND  Result Value Ref Range Status   Specimen Description   Final    BLOOD LEFT HAND Performed at [facility], [address], [place], [place] [address]    Special Requests   Final    BOTTLES DRAWN AEROBIC AND ANAEROBIC Blood Culture adequate volume Performed at [facility], [address], [place], [place] [address]    Culture  Setup Time   Final    GRAM NEGATIVE RODS IN BOTH AEROBIC AND ANAEROBIC BOTTLES CRITICAL RESULT CALLED TO, READ BACK BY AND VERIFIED WITH: PHARMD MICHELLE LILLISTON 01/20/2021 2249 BY JW Performed at [facility], [address], [place], [place] [address]    Culture ESCHERICHIA COLI (A)  Final   Report Status 01/22/2021 FINAL  Final   Organism ID, Bacteria ESCHERICHIA COLI  Final      Susceptibility   Escherichia coli - MIC*    AMPICILLIN >=32 RESISTANT Resistant     CEFAZOLIN <=4 SENSITIVE Sensitive     CEFEPIME <=0.12 SENSITIVE Sensitive     CEFTAZIDIME <=1 SENSITIVE Sensitive     CEFTRIAXONE <=0.25 SENSITIVE Sensitive     CIPROFLOXACIN <=0.25 SENSITIVE Sensitive     GENTAMICIN <=1  SENSITIVE Sensitive     IMIPENEM <=0.25 SENSITIVE Sensitive     TRIMETH/SULFA <=20 SENSITIVE Sensitive     AMPICILLIN/SULBACTAM 4 SENSITIVE Sensitive     PIP/TAZO <=4 SENSITIVE Sensitive     * ESCHERICHIA COLI  Blood Culture ID Panel (Reflexed)     Status: Abnormal   Collection Time: 01/20/21  8:19 AM  Result Value Ref Range Status   Enterococcus faecalis NOT DETECTED NOT DETECTED Final   Enterococcus Faecium NOT DETECTED NOT DETECTED Final   Listeria monocytogenes NOT DETECTED NOT DETECTED Final   Staphylococcus species NOT DETECTED NOT DETECTED Final   Staphylococcus aureus (BCID) NOT DETECTED NOT DETECTED Final   Staphylococcus epidermidis NOT DETECTED NOT DETECTED Final   Staphylococcus lugdunensis NOT DETECTED NOT DETECTED Final   Streptococcus species NOT DETECTED NOT DETECTED Final   Streptococcus agalactiae NOT DETECTED NOT DETECTED Final   Streptococcus pneumoniae NOT DETECTED NOT DETECTED Final   Streptococcus pyogenes NOT DETECTED NOT DETECTED Final   A.calcoaceticus-baumannii NOT DETECTED NOT DETECTED Final   Bacteroides fragilis NOT DETECTED NOT DETECTED Final   Enterobacterales DETECTED (A) NOT DETECTED Final    Comment: Enterobacterales represent a large order of gram negative bacteria, not a single organism. CRITICAL RESULT CALLED TO, READ BACK BY AND VERIFIED WITH: PHARMD MICHELLE LILLISTON 01/20/2021 2249 BY JW    Enterobacter cloacae complex NOT DETECTED NOT DETECTED Final   Escherichia coli DETECTED (A) NOT DETECTED Final    Comment: CRITICAL RESULT CALLED TO, READ BACK BY AND VERIFIED WITH: PHARMD MICHELLE LILLISTON 01/20/2021 2249 BY JW    Klebsiella aerogenes NOT DETECTED NOT DETECTED Final   Klebsiella oxytoca NOT DETECTED NOT DETECTED Final   Klebsiella pneumoniae NOT DETECTED NOT DETECTED Final   Proteus species NOT DETECTED NOT DETECTED Final   Salmonella species NOT DETECTED NOT DETECTED Final   Serratia marcescens NOT DETECTED NOT DETECTED Final    Haemophilus influenzae NOT DETECTED NOT DETECTED Final   Neisseria meningitidis NOT DETECTED NOT DETECTED Final   Pseudomonas aeruginosa NOT DETECTED NOT DETECTED Final   Stenotrophomonas maltophilia NOT DETECTED NOT DETECTED  Final   Candida albicans NOT DETECTED NOT DETECTED Final   Candida auris NOT DETECTED NOT DETECTED Final   Candida glabrata NOT DETECTED NOT DETECTED Final   Candida krusei NOT DETECTED NOT DETECTED Final   Candida parapsilosis NOT DETECTED NOT DETECTED Final   Candida tropicalis NOT DETECTED NOT DETECTED Final   Cryptococcus neoformans/gattii NOT DETECTED NOT DETECTED Final   CTX-M ESBL NOT DETECTED NOT DETECTED Final   Carbapenem resistance IMP NOT DETECTED NOT DETECTED Final   Carbapenem resistance KPC NOT DETECTED NOT DETECTED Final   Carbapenem resistance NDM NOT DETECTED NOT DETECTED Final   Carbapenem resist OXA 48 LIKE NOT DETECTED NOT DETECTED Final   Carbapenem resistance VIM NOT DETECTED NOT DETECTED Final    Comment: Performed at [facility], [address] [place], [place] [address]  Urine Culture     Status: Abnormal   Collection Time: 01/20/21  8:20 AM   Specimen: Urine, Clean Catch  Result Value Ref Range Status   Specimen Description   Final    URINE, CLEAN CATCH Performed at [facility], [address] [place], [place] [address]    Special Requests   Final    NONE Performed at [facility], [address], [place], [place] [address]    Culture 40,000 COLONIES/mL ESCHERICHIA COLI (A)  Final   Report Status 01/22/2021 FINAL  Final   Organism ID, Bacteria ESCHERICHIA COLI (A)  Final      Susceptibility   Escherichia coli - MIC*    AMPICILLIN >=32 RESISTANT Resistant     CEFAZOLIN <=4 SENSITIVE Sensitive     CEFEPIME <=0.12 SENSITIVE Sensitive     CEFTRIAXONE <=0.25 SENSITIVE Sensitive     CIPROFLOXACIN <=0.25 SENSITIVE Sensitive     GENTAMICIN <=1 SENSITIVE Sensitive     IMIPENEM <=0.25  SENSITIVE Sensitive     NITROFURANTOIN <=16 SENSITIVE Sensitive     TRIMETH/SULFA >=320 RESISTANT Resistant     AMPICILLIN/SULBACTAM 8 SENSITIVE Sensitive     PIP/TAZO <=4 SENSITIVE Sensitive     * 40,000 COLONIES/mL ESCHERICHIA COLI  Blood culture (routine x 2)     Status: None (Preliminary result)   Collection Time: 01/20/21  8:24 AM   Specimen: BLOOD  Result Value Ref Range Status   Specimen Description   Final    BLOOD RIGHT ANTECUBITAL Performed at [facility], [address] [place], [place] [address]    Special Requests   Final    BOTTLES DRAWN AEROBIC AND ANAEROBIC BACTERIAL CASTS Performed at [facility], [address], [place], [place] [address]    Culture  Setup Time   Final    GRAM NEGATIVE RODS ANAEROBIC BOTTLE ONLY CRITICAL VALUE NOTED.  VALUE IS CONSISTENT WITH PREVIOUSLY REPORTED AND CALLED VALUE. Performed at [facility], [address] [place], [place] [address]    Culture GRAM NEGATIVE RODS  Final   Report Status PENDING  Incomplete  MRSA Next Gen by PCR, Nasal     Status: None   Collection Time: 01/20/21  2:25 PM   Specimen: Nasal Mucosa; Nasal Swab  Result Value Ref Range Status   MRSA by PCR Next Gen NOT DETECTED NOT DETECTED Final    Comment: (NOTE) The GeneXpert MRSA Assay (FDA approved for NASAL specimens only), is one component of a comprehensive MRSA colonization surveillance program. It is not intended to diagnose MRSA infection nor to guide or monitor treatment for MRSA infections. Test performance  is not FDA approved in patients less than 14 years old. Performed at [facility], [address], [place], [place] [address]   MRSA Next Gen by PCR, Nasal     Status: None   Collection Time: 01/20/21  9:04 PM   Specimen: Nasal Mucosa; Nasal Swab  Result Value Ref Range Status   MRSA by PCR Next Gen NOT DETECTED NOT DETECTED Final    Comment: (NOTE) The GeneXpert MRSA Assay (FDA  approved for NASAL specimens only), is one component of a comprehensive MRSA colonization surveillance program. It is not intended to diagnose MRSA infection nor to guide or monitor treatment for MRSA infections. Test performance is not FDA approved in patients less than 51 years old. Performed at [facility], [address], [place], [place] [address]      Discharge Instructions:   Discharge Instructions     Diet - low sodium heart healthy   Complete by: As directed    Increase activity slowly   Complete by: As directed       Allergies as of 01/24/2021   No Known Allergies      Medication List     TAKE these medications    Belsomra 10 MG Tabs Generic drug: Suvorexant Take 10 mg by mouth at bedtime as needed.   cholecalciferol 25 MCG (1000 UNIT) tablet Commonly known as: VITAMIN D3 Take 1 tablet (1,000 Units total) by mouth daily.   levofloxacin 500 MG tablet Commonly known as: Levaquin Take 1 tablet (500 mg total) by mouth daily for 7 days.        Follow-up Information     Bjorn Pippin, MD. Schedule an appointment as soon as possible for a visit on 01/27/2021.   Specialty: Urology Contact information: [address] [place] [address] [phone]                   If you experience worsening of your admission symptoms, develop shortness of breath, life threatening emergency, suicidal or homicidal thoughts you must seek medical attention immediately by calling 911 or calling your MD immediately  if symptoms less severe.   You must read complete instructions/literature along with all the possible adverse reactions/side effects for all the medicines you take and that have been prescribed to you. Take any new medicines after you have completely understood and accept all the possible adverse reactions/side effects.    Please note   You were cared for by a hospitalist during your hospital stay. If you have any questions about  your discharge medications or the care you received while you were in the hospital after you are discharged, you can call the unit and asked to speak with the hospitalist on call if the hospitalist that took care of you is not available. Once you are discharged, your primary care physician will handle any further medical issues. Please note that NO REFILLS for any discharge medications will be authorized once you are discharged, as it is imperative that you return to your primary care physician (or establish a relationship with a primary care physician if you do not have one) for your aftercare needs so that they can reassess your need for medications and monitor your lab values.       Time coordinating discharge: 31 minutes  Signed:  Toshika Parrow  [facility] 01/24/2021, 9:52 AM   Pager on [email or web address]. If 7PM-7AM, please contact night-coverage at [email or web address]

## 2021-01-25 LAB — CULTURE, BLOOD (ROUTINE X 2)

## 2021-01-30 DIAGNOSIS — A4151 Sepsis due to Escherichia coli [E. coli]: Secondary | ICD-10-CM | POA: Diagnosis not present

## 2021-01-30 DIAGNOSIS — N132 Hydronephrosis with renal and ureteral calculous obstruction: Secondary | ICD-10-CM | POA: Diagnosis not present

## 2021-01-30 DIAGNOSIS — N390 Urinary tract infection, site not specified: Secondary | ICD-10-CM | POA: Diagnosis not present

## 2021-03-13 DIAGNOSIS — Z87442 Personal history of urinary calculi: Secondary | ICD-10-CM | POA: Diagnosis not present

## 2021-03-13 DIAGNOSIS — A4151 Sepsis due to Escherichia coli [E. coli]: Secondary | ICD-10-CM | POA: Diagnosis not present

## 2021-10-09 ENCOUNTER — Ambulatory Visit (HOSPITAL_COMMUNITY)
Admission: EM | Admit: 2021-10-09 | Discharge: 2021-10-09 | Disposition: A | Discharge status: Home / Self Care | Payer: Medicare Other | Attending: Emergency Medicine | Admitting: Emergency Medicine

## 2021-10-09 ENCOUNTER — Encounter (HOSPITAL_COMMUNITY): Payer: Self-pay

## 2021-10-09 DIAGNOSIS — R519 Headache, unspecified: Secondary | ICD-10-CM

## 2021-10-09 DIAGNOSIS — R300 Dysuria: Secondary | ICD-10-CM | POA: Diagnosis not present

## 2021-10-09 LAB — POCT URINALYSIS DIPSTICK, ED / UC
Bilirubin Urine: NEGATIVE
Bilirubin Urine: NEGATIVE
Glucose, UA: NEGATIVE mg/dL
Glucose, UA: NEGATIVE mg/dL
Hgb urine dipstick: NEGATIVE
Hgb urine dipstick: NEGATIVE
Ketones, ur: NEGATIVE mg/dL
Ketones, ur: NEGATIVE mg/dL
Leukocytes,Ua: NEGATIVE
Leukocytes,Ua: NEGATIVE
Nitrite: NEGATIVE
Nitrite: NEGATIVE
Protein, ur: NEGATIVE mg/dL
Protein, ur: NEGATIVE mg/dL
Specific Gravity, Urine: <=1.005 (ref 1.005–1.030)
Specific Gravity, Urine: <=1.005 (ref 1.005–1.030)
Urobilinogen, UA: 0.2 mg/dL (ref 0.0–1.0)
Urobilinogen, UA: 0.2 mg/dL (ref 0.0–1.0)
pH: 6 (ref 5.0–8.0)
pH: 6.5 (ref 5.0–8.0)

## 2021-10-09 NOTE — Discharge Instructions (Addendum)
Try tylenol every 6 hours for headache.  Follow up with primary care provider.  Hy th? tylenol c? sau 6 gi? ?? gi?m ?au ??u.  Theo di v?i nh cung c?p d?ch v? ch?m [place] ch[facility].

## 2021-10-09 NOTE — ED Provider Notes (Signed)
[facility]    CSN: [id number] Arrival date & time: 10/09/21  1355     History   Chief Complaint Chief Complaint  Patient presents with   Headache   Urinary Tract Infection    HPI Deborah Glass is a 71 y.o. female.  Interpreter used for this encounter. Presents with 2-week history of discomfort after urination. Intermittent. Some pain/dysuria but not every time. No hematuria, urgency, frequency. No flank pain or fevers. Has been drinking lots of water. History of pyelonephritis with subsequent sepsis and admission, history of kidney stone. History of urethral stent placement 12/2020.  Headache, left sided for 2 weeks, also intermittent. Reports 4/10. No dizziness, vision changes, balance problems. Has not tried anything for her symptoms.  Additionally reports trouble sleeping for the past 2 years.  She does have diagnoses of psychophysiological insomnia. Was put on belsomra.  History reviewed. No pertinent past medical history.  Patient Active Problem List   Diagnosis Date Noted   Acute pyelonephritis 01/22/2021   E. coli bacteremia 01/22/2021   Kidney stone    Sepsis (HCC) 01/20/2021   Septic shock (HCC) 01/20/2021   Dyspnea    Hydronephrosis    Psychophysiological insomnia 12/09/2020   Hyperlipidemia with target LDL less than 130 12/09/2020   Encounter for general adult medical examination with abnormal findings 12/09/2020   Visit for screening mammogram 12/09/2020   Asthma 07/04/2016   Esophageal stricture 03/06/2013   Vitamin D deficiency 03/26/2012    Past Surgical History:  Procedure Laterality Date   BREAST EXCISIONAL BIOPSY Right    CYSTOSCOPY W/ URETERAL STENT PLACEMENT Right 01/20/2021   Procedure: CYSTOSCOPY WITH STENT REPLACEMENT, RETROGRADE;  Surgeon: Bjorn Pippin, MD;  Location: [facility];  Service: Urology;  Laterality: Right;    OB History   No obstetric history on file.      Home Medications    Prior to Admission medications    Medication Sig Start Date End Date Taking? Authorizing Provider  cholecalciferol (VITAMIN D3) 25 MCG (1000 UNIT) tablet Take 1 tablet (1,000 Units total) by mouth daily. 01/11/20   Just, Azalee Course, FNP  Suvorexant (BELSOMRA) 10 MG TABS Take 10 mg by mouth at bedtime as needed. 12/10/20   Etta Grandchild, MD    Family History Family History  Problem Relation Age of Onset   Stomach cancer Father     Social History Social History   Tobacco Use   Smoking status: Never   Smokeless tobacco: Never  Substance Use Topics   Alcohol use: No   Drug use: No     Allergies   Patient has no known allergies.   Review of Systems Review of Systems  Neurological:  Positive for headaches.  Per HPI   Physical Exam Triage Vital Signs ED Triage Vitals  Enc Vitals Group     BP 10/09/21 1433 (!) 150/84     Pulse Rate 10/09/21 1433 (!) 59     Resp 10/09/21 1433 16     Temp 10/09/21 1433 98.2 F (36.8 C)     Temp Source 10/09/21 1433 Oral     SpO2 10/09/21 1433 96 %     Weight 10/09/21 1431 102 lb (46.3 kg)     Height 10/09/21 1431 4\' 9"  (1.448 m)     Head Circumference --      Peak Flow --      Pain Score 10/09/21 1430 4     Pain Loc --      Pain Edu? --  Excl. in GC? --    No data found.  Updated Vital Signs BP (!) 150/84 (BP Location: Left Arm)   Pulse (!) 59   Temp 98.2 F (36.8 C) (Oral)   Resp 16   Ht 4\' 9"  (1.448 m)   Wt 102 lb (46.3 kg)   SpO2 96%   BMI 22.07 kg/m    Physical Exam Vitals and nursing note reviewed.  Constitutional:      General: She is not in acute distress. HENT:     Head: Normocephalic and atraumatic.     Mouth/Throat:     Mouth: Mucous membranes are moist.     Pharynx: Oropharynx is clear.  Eyes:     Extraocular Movements: Extraocular movements intact.     Conjunctiva/sclera: Conjunctivae normal.     Pupils: Pupils are equal, round, and reactive to light.  Cardiovascular:     Rate and Rhythm: Normal rate and regular rhythm.      Heart sounds: Normal heart sounds.  Pulmonary:     Effort: Pulmonary effort is normal. No respiratory distress.     Breath sounds: Normal breath sounds.  Abdominal:     Palpations: Abdomen is soft.     Tenderness: There is no abdominal tenderness. There is no right CVA tenderness, left CVA tenderness, guarding or rebound.  Musculoskeletal:        General: Normal range of motion.     Cervical back: Normal range of motion.  Neurological:     Mental Status: She is alert and oriented to person, place, and time.     Sensory: No sensory deficit.     Motor: No weakness.     Coordination: Coordination normal.     Gait: Gait normal.     UC Treatments / Results  Labs (all labs ordered are listed, but only abnormal results are displayed) Labs Reviewed  POCT URINALYSIS DIPSTICK, ED / UC  POCT URINALYSIS DIPSTICK, ED / UC    EKG  Radiology No results found.  Procedures Procedures   Medications Ordered in UC Medications - No data to display  Initial Impression / Assessment and Plan / UC Course  I have reviewed the triage vital signs and the nursing notes.  Pertinent labs & imaging results that were available during my care of the patient were reviewed by me and considered in my medical decision making (see chart for details).  Recommend tylenol for headache. No red flags, neuro exam unremarkable. Urinalysis negative. No CVA tenderness. Follow up with primary care provider regarding symptoms. Strict ED precautions. Return precautions discussed. Patient agrees to plan and is discharged in stable condition.  Final Clinical Impressions(s) / UC Diagnoses   Final diagnoses:  Dysuria  Acute nonintractable headache, unspecified headache type     Discharge Instructions      Try tylenol every 6 hours for headache.  Follow up with primary care provider.  Hy th? tylenol c? sau 6 gi? ?? gi?m ?au ??u.  Theo di v?i nh cung c?p d?ch v? ch?m [place] chnh.    ED Prescriptions    None    PDMP not reviewed this encounter.   Amandy Chubbuck, , Lurena Joiner 10/09/21 1524

## 2021-10-09 NOTE — ED Triage Notes (Signed)
Patient having a headache on the left side of the head. Onset 2 weeks ago.  Patient having discomfort after urinating. Onset 2 weeks.   Patient has not been sleeping well for 2 years.

## 2021-12-22 ENCOUNTER — Ambulatory Visit (INDEPENDENT_AMBULATORY_CARE_PROVIDER_SITE_OTHER): Payer: Medicare Other | Admitting: Internal Medicine

## 2021-12-22 ENCOUNTER — Encounter: Payer: Self-pay | Admitting: Internal Medicine

## 2021-12-22 VITALS — BP 126/84 | HR 73 | Temp 97.8°F | Ht <= 58 in | Wt 104.0 lb

## 2021-12-22 DIAGNOSIS — N200 Calculus of kidney: Secondary | ICD-10-CM | POA: Diagnosis not present

## 2021-12-22 DIAGNOSIS — Z0000 Encounter for general adult medical examination without abnormal findings: Secondary | ICD-10-CM | POA: Diagnosis not present

## 2021-12-22 DIAGNOSIS — Z23 Encounter for immunization: Secondary | ICD-10-CM | POA: Diagnosis not present

## 2021-12-22 DIAGNOSIS — Z0001 Encounter for general adult medical examination with abnormal findings: Secondary | ICD-10-CM

## 2021-12-22 DIAGNOSIS — E2839 Other primary ovarian failure: Secondary | ICD-10-CM | POA: Diagnosis not present

## 2021-12-22 DIAGNOSIS — J4520 Mild intermittent asthma, uncomplicated: Secondary | ICD-10-CM | POA: Diagnosis not present

## 2021-12-22 DIAGNOSIS — E785 Hyperlipidemia, unspecified: Secondary | ICD-10-CM | POA: Diagnosis not present

## 2021-12-22 DIAGNOSIS — Z1231 Encounter for screening mammogram for malignant neoplasm of breast: Secondary | ICD-10-CM

## 2021-12-22 LAB — CBC WITH DIFFERENTIAL/PLATELET
Basophils Absolute: 0 10*3/uL (ref 0.0–0.1)
Basophils Relative: 0.5 % (ref 0.0–3.0)
Eosinophils Absolute: 0.3 10*3/uL (ref 0.0–0.7)
Eosinophils Relative: 6.3 % — ABNORMAL HIGH (ref 0.0–5.0)
HCT: 36.4 % (ref 36.0–46.0)
Hemoglobin: 12.4 g/dL (ref 12.0–15.0)
Lymphocytes Relative: 34 % (ref 12.0–46.0)
Lymphs Abs: 1.7 10*3/uL (ref 0.7–4.0)
MCHC: 34.1 g/dL (ref 30.0–36.0)
MCV: 91.5 fl (ref 78.0–100.0)
Monocytes Absolute: 0.4 10*3/uL (ref 0.1–1.0)
Monocytes Relative: 8.3 % (ref 3.0–12.0)
Neutro Abs: 2.6 10*3/uL (ref 1.4–7.7)
Neutrophils Relative %: 50.9 % (ref 43.0–77.0)
Platelets: 233 10*3/uL (ref 150.0–400.0)
RBC: 3.97 Mil/uL (ref 3.87–5.11)
RDW: 13.4 % (ref 11.5–15.5)
WBC: 5.1 10*3/uL (ref 4.0–10.5)

## 2021-12-22 LAB — HEPATIC FUNCTION PANEL
ALT: 9 U/L (ref 0–35)
AST: 18 U/L (ref 0–37)
Albumin: 4.1 g/dL (ref 3.5–5.2)
Alkaline Phosphatase: 58 U/L (ref 39–117)
Bilirubin, Direct: 0.1 mg/dL (ref 0.0–0.3)
Total Bilirubin: 0.4 mg/dL (ref 0.2–1.2)
Total Protein: 7.3 g/dL (ref 6.0–8.3)

## 2021-12-22 LAB — LIPID PANEL
Cholesterol: 222 mg/dL — ABNORMAL HIGH (ref 0–200)
HDL: 65.3 mg/dL (ref >39.00)
LDL Cholesterol: 133 mg/dL — ABNORMAL HIGH (ref 0–99)
NonHDL: 156.31
Total CHOL/HDL Ratio: 3
Triglycerides: 115 mg/dL (ref 0.0–149.0)
VLDL: 23 mg/dL (ref 0.0–40.0)

## 2021-12-22 LAB — BASIC METABOLIC PANEL
BUN: 14 mg/dL (ref 6–23)
CO2: 29 mEq/L (ref 19–32)
Calcium: 9.2 mg/dL (ref 8.4–10.5)
Chloride: 105 mEq/L (ref 96–112)
Creatinine, Ser: 0.74 mg/dL (ref 0.40–1.20)
GFR: 80.33 mL/min (ref >60.00)
Glucose, Bld: 79 mg/dL (ref 70–99)
Potassium: 3.6 mEq/L (ref 3.5–5.1)
Sodium: 139 mEq/L (ref 135–145)

## 2021-12-22 LAB — TSH: TSH: 2.04 u[IU]/mL (ref 0.35–5.50)

## 2021-12-22 MED ORDER — SHINGRIX 50 MCG/0.5ML IM SUSR: 0.5000 mL | Freq: Once | INTRAMUSCULAR | 1 refills | Status: AC

## 2021-12-22 MED ORDER — BOOSTRIX 5-2.5-18.5 LF-MCG/0.5 IM SUSP: 0.5000 mL | Freq: Once | INTRAMUSCULAR | 0 refills | Status: AC

## 2021-12-22 NOTE — Patient Instructions (Signed)

## 2021-12-22 NOTE — Progress Notes (Signed)
Subjective:  Patient ID: Deborah Glass, female    DOB: May 14, 1950  Age: 71 y.o. MRN: [id number]  CC: Annual Exam and Hyperlipidemia   HPI Deborah Glass presents for a CPX and f/up -   She walks about 50 minutes a day and does not experience chest pain, shortness of breath, diaphoresis, fatigue, or edema.  Outpatient Medications Prior to Visit  Medication Sig Dispense Refill   cholecalciferol (VITAMIN D3) 25 MCG (1000 UNIT) tablet Take 1 tablet (1,000 Units total) by mouth daily. 90 tablet 1   Suvorexant (BELSOMRA) 10 MG TABS Take 10 mg by mouth at bedtime as needed. 90 tablet 0   No facility-administered medications prior to visit.    ROS Review of Systems  Constitutional: Negative.  Negative for diaphoresis, fatigue and unexpected weight change.  HENT: Negative.    Eyes: Negative.   Respiratory:  Negative for cough, chest tightness, shortness of breath and wheezing.   Cardiovascular:  Negative for chest pain, palpitations and leg swelling.  Gastrointestinal:  Negative for abdominal pain, constipation, diarrhea, nausea and vomiting.  Endocrine: Negative.   Genitourinary: Negative.  Negative for difficulty urinating, dysuria, flank pain and hematuria.  Musculoskeletal: Negative.   Skin: Negative.   Neurological: Negative.  Negative for dizziness and weakness.  Hematological:  Negative for adenopathy. Does not bruise/bleed easily.  Psychiatric/Behavioral: Negative.      Objective:  BP 126/84 (BP Location: Left Arm, Patient Position: Sitting, Cuff Size: Normal)   Pulse 73   Temp 97.8 F (36.6 C) (Oral)   Ht 4\' 9"  (1.448 m)   Wt 104 lb (47.2 kg)   SpO2 95%   BMI 22.51 kg/m   BP Readings from Last 3 Encounters:  12/22/21 126/84  10/09/21 (!) 150/84  01/24/21 125/79    Wt Readings from Last 3 Encounters:  12/22/21 104 lb (47.2 kg)  10/09/21 102 lb (46.3 kg)  01/20/21 108 lb 0.4 oz (49 kg)    Physical Exam Vitals reviewed.  Constitutional:      Appearance:  She is not ill-appearing.  HENT:     Nose: Nose normal.     Mouth/Throat:     Mouth: Mucous membranes are moist.  Eyes:     General: No scleral icterus.    Conjunctiva/sclera: Conjunctivae normal.  Cardiovascular:     Rate and Rhythm: Normal rate and regular rhythm.     Heart sounds: No murmur heard. Pulmonary:     Effort: Pulmonary effort is normal.     Breath sounds: No stridor. No wheezing, rhonchi or rales.  Abdominal:     General: Abdomen is flat.     Palpations: There is no mass.     Tenderness: There is no abdominal tenderness. There is no guarding.     Hernia: No hernia is present.  Musculoskeletal:        General: Normal range of motion.     Cervical back: Neck supple.     Right lower leg: No edema.     Left lower leg: No edema.  Lymphadenopathy:     Cervical: No cervical adenopathy.  Skin:    General: Skin is warm and dry.  Neurological:     General: No focal deficit present.  Psychiatric:        Mood and Affect: Mood normal.        Behavior: Behavior normal.     Lab Results  Component Value Date   WBC 5.1 12/22/2021   HGB 12.4 12/22/2021   HCT  36.4 12/22/2021   PLT 233.0 12/22/2021   GLUCOSE 79 12/22/2021   CHOL 222 (H) 12/22/2021   TRIG 115.0 12/22/2021   HDL 65.30 12/22/2021   LDLCALC 133 (H) 12/22/2021   ALT 9 12/22/2021   AST 18 12/22/2021   NA 139 12/22/2021   K 3.6 12/22/2021   CL 105 12/22/2021   CREATININE 0.74 12/22/2021   BUN 14 12/22/2021   CO2 29 12/22/2021   TSH 2.04 12/22/2021   INR 1.6 (H) 01/21/2021   HGBA1C 5.4 01/20/2021    No results found.  Assessment & Plan:   Deborah Glass was seen today for annual exam and hyperlipidemia.  Diagnoses and all orders for this visit:  Flu vaccine need -     Flu Vaccine QUAD High Dose(Fluad)  Encounter for general adult medical examination with abnormal findings- Exam completed, labs reviewed, vaccines reviewed-she agreed to a flu vaccine but deferred on prevnar, cancer screenings addressed,  patient education was given.  Hyperlipidemia with target LDL less than 130- Statin is not indicated. -     Basic metabolic panel; Future -     Lipid panel; Future -     CBC with Differential/Platelet; Future -     TSH; Future -     Hepatic function panel; Future -     Hepatic function panel -     TSH -     CBC with Differential/Platelet -     Lipid panel -     Basic metabolic panel  Mild intermittent asthma without complication- She has no evidence of disease. -     Basic metabolic panel; Future -     CBC with Differential/Platelet; Future -     CBC with Differential/Platelet -     Basic metabolic panel  Visit for screening mammogram -     MM DIGITAL SCREENING BILATERAL; Future  Kidney stone- Her UA is normal. -     Urinalysis, Routine w reflex microscopic; Future -     Urinalysis, Routine w reflex microscopic  Estrogen deficiency -     DG Bone Density; Future  Other orders -     Zoster Vaccine Adjuvanted [facility]) injection; Inject 0.5 mLs into the muscle once for 1 dose. -     Tdap (BOOSTRIX) 5-2.5-18.5 LF-MCG/0.5 injection; Inject 0.5 mLs into the muscle once for 1 dose.   I am having Deborah Glass start on Shingrix and Boostrix. I am also having her maintain her cholecalciferol and Belsomra.  Meds ordered this encounter  Medications   Zoster Vaccine Adjuvanted [facility]) injection    Sig: Inject 0.5 mLs into the muscle once for 1 dose.    Dispense:  0.5 mL    Refill:  1   Tdap (BOOSTRIX) 5-2.5-18.5 LF-MCG/0.5 injection    Sig: Inject 0.5 mLs into the muscle once for 1 dose.    Dispense:  0.5 mL    Refill:  0     Follow-up: Return in about 6 months (around 06/23/2022).  Deborah Calico, MD

## 2021-12-23 ENCOUNTER — Encounter: Payer: Self-pay | Admitting: Internal Medicine

## 2021-12-23 LAB — URINALYSIS, ROUTINE W REFLEX MICROSCOPIC
Bilirubin Urine: NEGATIVE
Hgb urine dipstick: NEGATIVE
Ketones, ur: NEGATIVE
Leukocytes,Ua: NEGATIVE
Nitrite: NEGATIVE
RBC / HPF: NONE SEEN (ref 0–?)
Specific Gravity, Urine: <=1.005 — AB (ref 1.000–1.030)
Total Protein, Urine: NEGATIVE
Urine Glucose: NEGATIVE
Urobilinogen, UA: 0.2 (ref 0.0–1.0)
WBC, UA: NONE SEEN (ref 0–?)
pH: 6 (ref 5.0–8.0)

## 2022-03-02 ENCOUNTER — Telehealth: Payer: Self-pay | Admitting: Internal Medicine

## 2022-03-02 NOTE — Telephone Encounter (Signed)
Left message for patient to call back to schedule Medicare Annual Wellness Visit   No hx of AWV   Please schedule at anytime with [facility] Health Advisor if patient calls the office back.     Any questions, please call me at [phone]  

## 2022-03-31 ENCOUNTER — Ambulatory Visit (INDEPENDENT_AMBULATORY_CARE_PROVIDER_SITE_OTHER): Payer: Medicare Other | Admitting: Emergency Medicine

## 2022-03-31 ENCOUNTER — Encounter: Payer: Self-pay | Admitting: Emergency Medicine

## 2022-03-31 VITALS — BP 120/74 | HR 67 | Temp 97.9°F | Ht <= 58 in | Wt 110.0 lb

## 2022-03-31 DIAGNOSIS — Z131 Encounter for screening for diabetes mellitus: Secondary | ICD-10-CM

## 2022-03-31 DIAGNOSIS — G44211 Episodic tension-type headache, intractable: Secondary | ICD-10-CM

## 2022-03-31 DIAGNOSIS — Z130 Encounter for screening for diseases of the blood and blood-forming organs and certain disorders involving the immune mechanism: Secondary | ICD-10-CM

## 2022-03-31 DIAGNOSIS — Z13220 Encounter for screening for lipoid disorders: Secondary | ICD-10-CM | POA: Diagnosis not present

## 2022-03-31 LAB — CBC WITH DIFFERENTIAL/PLATELET
Basophils Absolute: 0 10*3/uL (ref 0.0–0.1)
Basophils Relative: 0.5 % (ref 0.0–3.0)
Eosinophils Absolute: 0.3 10*3/uL (ref 0.0–0.7)
Eosinophils Relative: 7.8 % — ABNORMAL HIGH (ref 0.0–5.0)
HCT: 37.4 % (ref 36.0–46.0)
Hemoglobin: 12.6 g/dL (ref 12.0–15.0)
Lymphocytes Relative: 32.6 % (ref 12.0–46.0)
Lymphs Abs: 1.3 10*3/uL (ref 0.7–4.0)
MCHC: 33.6 g/dL (ref 30.0–36.0)
MCV: 92.4 fl (ref 78.0–100.0)
Monocytes Absolute: 0.3 10*3/uL (ref 0.1–1.0)
Monocytes Relative: 7.1 % (ref 3.0–12.0)
Neutro Abs: 2 10*3/uL (ref 1.4–7.7)
Neutrophils Relative %: 52 % (ref 43.0–77.0)
Platelets: 260 10*3/uL (ref 150.0–400.0)
RBC: 4.05 Mil/uL (ref 3.87–5.11)
RDW: 13.4 % (ref 11.5–15.5)
WBC: 3.9 10*3/uL — ABNORMAL LOW (ref 4.0–10.5)

## 2022-03-31 LAB — COMPREHENSIVE METABOLIC PANEL
ALT: 10 U/L (ref 0–35)
AST: 20 U/L (ref 0–37)
Albumin: 4.2 g/dL (ref 3.5–5.2)
Alkaline Phosphatase: 58 U/L (ref 39–117)
BUN: 17 mg/dL (ref 6–23)
CO2: 30 mEq/L (ref 19–32)
Calcium: 9.4 mg/dL (ref 8.4–10.5)
Chloride: 105 mEq/L (ref 96–112)
Creatinine, Ser: 0.7 mg/dL (ref 0.40–1.20)
GFR: 85.7 mL/min (ref >60.00)
Glucose, Bld: 75 mg/dL (ref 70–99)
Potassium: 3.9 mEq/L (ref 3.5–5.1)
Sodium: 142 mEq/L (ref 135–145)
Total Bilirubin: 0.3 mg/dL (ref 0.2–1.2)
Total Protein: 7.3 g/dL (ref 6.0–8.3)

## 2022-03-31 LAB — TSH: TSH: 3.26 u[IU]/mL (ref 0.35–5.50)

## 2022-03-31 LAB — LIPID PANEL
Cholesterol: 223 mg/dL — ABNORMAL HIGH (ref 0–200)
HDL: 66.5 mg/dL (ref >39.00)
LDL Cholesterol: 134 mg/dL — ABNORMAL HIGH (ref 0–99)
NonHDL: 156.4
Total CHOL/HDL Ratio: 3
Triglycerides: 112 mg/dL (ref 0.0–149.0)
VLDL: 22.4 mg/dL (ref 0.0–40.0)

## 2022-03-31 LAB — SEDIMENTATION RATE: Sed Rate: 34 mm/hr — ABNORMAL HIGH (ref 0–30)

## 2022-03-31 LAB — HEMOGLOBIN A1C: Hgb A1c MFr Bld: 5.6 % (ref 4.6–6.5)

## 2022-03-31 MED ORDER — TRAMADOL HCL 50 MG PO TABS: 50.0000 mg | ORAL_TABLET | Freq: Three times a day (TID) | ORAL | 0 refills | Status: DC | PRN

## 2022-03-31 NOTE — Assessment & Plan Note (Signed)
Clinically stable.  No red flag signs or symptoms Benign physical examination Differential diagnosis discussed with patient Blood work done today.  Doubt temporal arteritis Advised to take Tylenol for mild to moderate pain and tramadol for moderate to severe pain Will monitor symptoms for the next several days/weeks Advised to contact the office if no better or worse during the next several days and to follow-up with PCP as soon as possible ED precautions given.

## 2022-03-31 NOTE — Patient Instructions (Signed)
?au ??u thng th???ng khng ro? nguyn nhn General Headache Without Cause ?au ??u l ?au ho?c c?m gic kh ch?u ?? xung Darnelle Going ??u ho?c ? vng c?. C nhi?u nguyn nhn v lo?i ?au ??u. M?t s? lo?i th??ng g?p bao g?m: ?au ??u do c?ng th?ng. ?au n?a ??u. ?au ??u t??ng c?n. ?au ??u hng ngy m?n tnh. ?i khi, c th? khng pht hi?n ???c nguyn nhn c? th? gy ra ?au ??u. Tun th? nh?ng h??ng d?n ny ? nh: Theo di tnh tr?ng c?a qu v? ?? pht hi?n b?t k? thay ??i no. Hy cho chuyn gia ch?m [place] s?c kh?e bi?t v? cc thay ??i ?. Ti?n hnh nh?ng b???c sau ?? gip ca?i thi?n ti?nh tra?ng cu?a quy? vi?: X? tr ?au     Ch? s? d?ng thu?c khng k ??n v thu?c k ??n theo ch? d?n c?a chuyn gia ch?m [place] s?c kh?e. ?i?u tr? c th? bao g?m dng thu?c gi?m ?au theo ???ng u?ng ho?c bi ln da. N?m trong m?t phng t?i, yn t?nh khi qu v? b? ?au ??u. Gi? cho nh sng d?u nh? n?u nh sng m?nh lm qu v? kh ch?u v lm ?au ??u tr?m tr?ng h?n. N?u ???c ch? d?n, hy ch??m ? ln vng ??u v c?: Cho ? l?nh vo ti ni lng. ?? kh?n t?m ? gi?a da v ti ch??m. Ch??m ? l?nh trong 20 pht, 2-3 l?n m?i ngy. B? ? l?nh ra n?u da qu v? chuy?n sang mu ?? t??i. ?i?u ny r?t quan tr?ng. N?u qu v? khng th? c?m th?y ?au, nng ho?c l?nh, qu v? c nguy c? cao h?n b? t?n th??ng vng ?. N?u ???c ch? d?n, hy ch??m nng vo vng b? ?nh h??ng. S? d?ng ngu?n nhi?t m chuyn gia ch?m [place] s?c kh?e khuy?n ngh?, ch?ng h?n nh? ti ch??m nhi?t ?m ho?c mi?ng ??m ch??m nng. ?? kh?n t?m ? gi?a da v ngu?n nhi?t. Duy tr ngu?n nhi?t trong 20-30 pht. B? ngu?n nhi?t ra n?u da qu v? chuy?n sang mu ?? nh?t. ?i?u ny ??c bi?t quan tr?ng n?u qu v? khng th? c?m th?y ?au, nng, ho?c l?nh. Qu v? c nguy c? cao h?n b? b?ng. ?n v u?ng ?n u?ng theo l?ch bnh th??ng. N?u qu v? u?ng r??u: Gi?i h?n l??ng r??u qu v? u?ng ? m?c: 0-1 ly/ngy ??i v?i ph? n? khng mang thai. 0-2 ly/ngy ??i v?i nam gi?i. Bi?t m?t ly c bao  nhiu r??u. ? M?, m?t ly t??ng ???ng v?i m?t chai bia 12 ao-x? (355 mL), m?t ly r??u vang 5 ao-x? (148 mL), ho?c m?t ly r??u m?nh 1 ao-x? (44 mL). Ng?ng u?ng cafein, ho?c gi?m l??ng cafein quy? vi? u?ng, ho?c ng?ng u?ng caffeine. U?ng ?? n??c ?? gi? cho n??c ti?u c mu vng nh?t. H??ng d?n chung  Ghi nh?t k ?au ??u hng ngy ?? gip tm ra ?i?u g c th? gy ra cc c?n ?au ??u. V d?, hy ghi l?i: Qu v? ?n v u?ng g. Qu v? ? ng? bao lu. B?t k? thay ??i no trong ch? ?? ?n ho?c thu?c men c?a qu v?. Th? k? thu?t xoa bp ho?c cc k? thu?t th? gin khc. H?n ch? c?ng th?ng. Ng?i th?ng l?ng v khng lm c?ng cc c?. Khng s? d?ng b?t k? s?n ph?m no c nicotine ho?c thu?c l. Nh?ng s?n ph?m ny bao g?m thu?c l d?ng ht, thu?c l d?ng nhai v d?ng c? ht thu?c, ch?ng h?n nh?  thu?c l ?i?n t?. N?u qu v? c?n gip ?? ?? cai thu?c, hy h?i chuyn gia ch?m [place] s?c kh?e. T?p th? d?c th??ng xuyn theo ch? d?n c?a chuyn gia ch?m [place] s?c kh?e. Ng? theo m?t l?ch trnh bi?nh th??ng. Ng? 7-9 ti?ng m?i ?m ho?c ng? ?? th?i gian theo khuy?n ngh? c?a chuyn gia ch?m [place] s?c kh?e. Tun th? theo t?t c? cc l?n khm l?i. ?i?u ny c vai tr quan tr?ng. Hy lin l?c v?i chuyn gia ch?m [place] s?c kh?e n?u: Thu?c khng gia?m ????c tri?u ch??ng. Quy? vi? b? ?au ??u khc v??i ?au ??u thng th??ng. Qu v? b? bu?n nn ho?c qu v? nn. Qu v? b? s?t. Yu c?u tr? gip ngay l?p t?c n?u: C?n ?au ??u c?a qu v?: Nhanh chng tr? nn d? d?i. Tr? nn tr?m tr?ng h?n sau khi ho?t ??ng th? ch?t t? m?c ?? v?a ??n m?nh. Qu v? c b?t k? tri?u ch?ng no trong s? cc tri?u ch?ng ny: Nn nhi?u l?n. ?au ho?c c?ng c?. Thay ??i th? l?c. ?au ? m?t ho?c ? tai. Cc v?n ?? v?i kh? n?ng ni. Y?u c? ho?c m?t ki?m sot c?. M?t th?ng b?ng ho?c m?t ?i?u ph?i. Qu v? c?m th?y mu?n ng?t ho?c ng?t. Qu v? b? l l?n. Qu v? b? co gi?t. Nh?ng tri?u ch?ng ny c th? l bi?u hi?n c?a m?t v?n ?? nghim tr?ng c?n c?p c?u. Khng ch?  xem tri?u ch?ng c h?t khng. Hy ?i khm ngay l?p t?c. G?i cho d?ch v? c?p c?u t?i ??a ph??ng (911 ? [place]?). Khng t? li xe ??n b?nh vi?n. Tm t?t ?au ??u l ?au ho?c c?m gic kh ch?u ?? xung Holli Humbles ??u ho?c ? vng c?. C nhi?u nguyn nhn v lo?i ?au ??u. Trong m?t s? tr??ng h?p, c th? khng tm ra nguyn nhn. Ghi nh?t k ?au ??u hng ngy ?? gip tm ra ?i?u g c th? gy ra cc c?n ?au ??u. Theo di tnh tr?ng c?a qu v? ?? pht hi?n b?t k? thay ??i no. Hy cho chuyn gia ch?m [place] s?c kh?e bi?t v? cc thay ??i ?. Hy lin h? v?i chuyn gia ch?m [place] s?c kh?e n?u qu v? b? m?t c?n ?au ??u khc v?i c?n ?au ??u thng th??ng, ho?c n?u dng thu?c khng lm gi?m cc tri?u ch?ng c?a qu v?. Tm s? tr? gip ngay l?p t?c n?u ?au ??u tr? nn d? d?i, qu v? nn i, m?t th? l?c, m?t th?ng b?ng, ho?c b? co gi?t. Thng tin ny khng nh?m m?c ?ch thay th? cho l?i khuyn m chuyn gia ch?m [place] s?c kh?e ni v?i qu v?. Hy b?o ??m qu v? ph?i th?o lu?n b?t k? v?n ?? g m qu v? c v?i chuyn gia ch?m [place] s?c kh?e c?a qu v?. Document Revised: 08/16/2020 Document Reviewed: 08/16/2020 Elsevier Patient Education  [place].

## 2022-03-31 NOTE — Progress Notes (Signed)
Deborah Glass 72 y.o.   Chief Complaint  Patient presents with   Acute Visit    Headache on righ side x 1 week     HISTORY OF PRESENT ILLNESS: Acute problem visit today.  Patient of Dr. Sanda Linger This is a 72 y.o. female complaining of 2-week history of right-sided intermittent throbbing headache without associated symptoms. Sometimes the pain radiates to right ear.  No rashes. No other complaints or medical concerns today.  No new medications.  No new diets.  HPI   Prior to Admission medications   Medication Sig Start Date End Date Taking? Authorizing Provider  cholecalciferol (VITAMIN D3) 25 MCG (1000 UNIT) tablet Take 1 tablet (1,000 Units total) by mouth daily. 01/11/20  Yes Just, Azalee Course, FNP  Suvorexant (BELSOMRA) 10 MG TABS Take 10 mg by mouth at bedtime as needed. 12/10/20  Yes Etta Grandchild, MD    No Known Allergies  Patient Active Problem List   Diagnosis Date Noted   Estrogen deficiency 12/22/2021   Psychophysiological insomnia 12/09/2020   Hyperlipidemia with target LDL less than 130 12/09/2020   Asthma 07/04/2016   Esophageal stricture 03/06/2013   Vitamin D deficiency 03/26/2012    No past medical history on file.  Past Surgical History:  Procedure Laterality Date   BREAST EXCISIONAL BIOPSY Right    CYSTOSCOPY W/ URETERAL STENT PLACEMENT Right 01/20/2021   Procedure: CYSTOSCOPY WITH STENT REPLACEMENT, RETROGRADE;  Surgeon: Bjorn Pippin, MD;  Location: [facility];  Service: Urology;  Laterality: Right;    Social History   Socioeconomic History   Marital status: Married    Spouse name: Not on file   Number of children: Not on file   Years of education: Not on file   Highest education level: Not on file  Occupational History   Not on file  Tobacco Use   Smoking status: Never   Smokeless tobacco: Never  Substance and Sexual Activity   Alcohol use: No   Drug use: No   Sexual activity: Not on file  Other Topics Concern   Not on file  Social  History Narrative   Not on file   Social Determinants of Health   Financial Resource Strain: Not on file  Food Insecurity: Not on file  Transportation Needs: Not on file  Physical Activity: Not on file  Stress: Not on file  Social Connections: Not on file  Intimate Partner Violence: Not on file    Family History  Problem Relation Age of Onset   Stomach cancer Father      Review of Systems  Constitutional: Negative.  Negative for chills and fever.  HENT: Negative.  Negative for congestion and sore throat.   Respiratory: Negative.  Negative for cough and shortness of breath.   Cardiovascular: Negative.  Negative for chest pain and palpitations.  Gastrointestinal:  Negative for abdominal pain, diarrhea, nausea and vomiting.  Genitourinary: Negative.  Negative for dysuria and hematuria.  Skin: Negative.  Negative for rash.  Neurological:  Positive for headaches.  All other systems reviewed and are negative.  Today's Vitals   03/31/22 0857  BP: 120/74  Pulse: 67  Temp: 97.9 F (36.6 C)  TempSrc: Oral  SpO2: 95%  Weight: 110 lb (49.9 kg)  Height: 4\' 9"  (1.448 m)   Body mass index is 23.8 kg/m.   Physical Exam Vitals reviewed.  Constitutional:      Appearance: Normal appearance.  HENT:     Head: Normocephalic.     Right  Ear: Tympanic membrane, ear canal and external ear normal.     Left Ear: Tympanic membrane, ear canal and external ear normal.     Mouth/Throat:     Mouth: Mucous membranes are moist.     Pharynx: Oropharynx is clear.  Eyes:     Extraocular Movements: Extraocular movements intact.     Conjunctiva/sclera: Conjunctivae normal.     Pupils: Pupils are equal, round, and reactive to light.  Cardiovascular:     Rate and Rhythm: Normal rate and regular rhythm.     Pulses: Normal pulses.     Heart sounds: Normal heart sounds.  Pulmonary:     Effort: Pulmonary effort is normal.     Breath sounds: Normal breath sounds.  Musculoskeletal:     Cervical  back: No tenderness.  Lymphadenopathy:     Cervical: No cervical adenopathy.  Skin:    General: Skin is warm and dry.     Comments: No temporal artery tenderness  Neurological:     General: No focal deficit present.     Mental Status: She is alert and oriented to person, place, and time.     Cranial Nerves: No cranial nerve deficit.     Sensory: No sensory deficit.     Motor: No weakness.     Coordination: Coordination normal.     Gait: Gait normal.     Deep Tendon Reflexes: Reflexes normal.  Psychiatric:        Mood and Affect: Mood normal.        Behavior: Behavior normal.      ASSESSMENT & PLAN: A total of 42 minutes was spent with the patient and counseling/coordination of care regarding repairing for this visit, review of most recent office visit notes, review of multiple chronic medical conditions under management, review of all medications, need for blood work, differential diagnosis of headaches, pain management, prognosis, documentation and need for follow-up.  Problem List Items Addressed This Visit       Other   Intractable episodic tension-type headache - Primary    Clinically stable.  No red flag signs or symptoms Benign physical examination Differential diagnosis discussed with patient Blood work done today.  Doubt temporal arteritis Advised to take Tylenol for mild to moderate pain and tramadol for moderate to severe pain Will monitor symptoms for the next several days/weeks Advised to contact the office if no better or worse during the next several days and to follow-up with PCP as soon as possible ED precautions given.      Relevant Medications   traMADol (ULTRAM) 50 MG tablet   Other Relevant Orders   Sedimentation rate   CBC with Differential/Platelet   Comprehensive metabolic panel   TSH     Agustina Caroli, MD [facility]

## 2022-04-01 ENCOUNTER — Telehealth: Payer: Self-pay | Admitting: Internal Medicine

## 2022-04-01 ENCOUNTER — Other Ambulatory Visit: Payer: Self-pay | Admitting: Emergency Medicine

## 2022-04-01 DIAGNOSIS — G44211 Episodic tension-type headache, intractable: Secondary | ICD-10-CM

## 2022-04-01 MED ORDER — ACETAMINOPHEN-CODEINE 300-30 MG PO TABS: 1.0000 | ORAL_TABLET | Freq: Four times a day (QID) | ORAL | 0 refills | Status: DC | PRN

## 2022-04-01 NOTE — Telephone Encounter (Signed)
Called pt, LVM.   

## 2022-04-01 NOTE — Telephone Encounter (Signed)
Patient husband called back and stated that tylenol and advil doesn't work or help, would like a callback , [phone].

## 2022-04-01 NOTE — Telephone Encounter (Signed)
New prescription for Tylenol codeine sent to pharmacy of record today.  Thanks.

## 2022-04-01 NOTE — Telephone Encounter (Signed)
Patient husband called stating the tramadol 50mg  that was prescribed by Dr. Mitchel Honour at her appointment yesterday has made her throw up, they would like to know if something else could be sent in for her. Patient can be reached at [phone].

## 2022-04-01 NOTE — Telephone Encounter (Signed)
Stop tramadol.  Recommend to take Tylenol and or Advil for headaches.  Thanks.

## 2022-04-01 NOTE — Telephone Encounter (Signed)
Called pt, LVM to discuss.  

## 2022-05-21 ENCOUNTER — Ambulatory Visit (INDEPENDENT_AMBULATORY_CARE_PROVIDER_SITE_OTHER): Payer: Medicare Other

## 2022-05-21 ENCOUNTER — Encounter: Payer: Self-pay | Admitting: Internal Medicine

## 2022-05-21 ENCOUNTER — Ambulatory Visit (INDEPENDENT_AMBULATORY_CARE_PROVIDER_SITE_OTHER): Payer: Medicare Other | Admitting: Internal Medicine

## 2022-05-21 VITALS — BP 126/80 | HR 79 | Temp 98.0°F | Ht <= 58 in | Wt 106.0 lb

## 2022-05-21 DIAGNOSIS — E2839 Other primary ovarian failure: Secondary | ICD-10-CM

## 2022-05-21 DIAGNOSIS — J069 Acute upper respiratory infection, unspecified: Secondary | ICD-10-CM

## 2022-05-21 DIAGNOSIS — J4520 Mild intermittent asthma, uncomplicated: Secondary | ICD-10-CM | POA: Diagnosis not present

## 2022-05-21 DIAGNOSIS — R052 Subacute cough: Secondary | ICD-10-CM

## 2022-05-21 DIAGNOSIS — Z1231 Encounter for screening mammogram for malignant neoplasm of breast: Secondary | ICD-10-CM

## 2022-05-21 DIAGNOSIS — R059 Cough, unspecified: Secondary | ICD-10-CM | POA: Diagnosis not present

## 2022-05-21 MED ORDER — ALBUTEROL SULFATE HFA 108 (90 BASE) MCG/ACT IN AERS: 2.0000 | INHALATION_SPRAY | Freq: Four times a day (QID) | RESPIRATORY_TRACT | 3 refills | Status: AC | PRN

## 2022-05-21 MED ORDER — HYDROCODONE BIT-HOMATROP MBR 5-1.5 MG/5ML PO SOLN: 5.0000 mL | Freq: Three times a day (TID) | ORAL | 0 refills | Status: AC | PRN

## 2022-05-21 NOTE — Progress Notes (Signed)
Subjective:  Patient ID: Deborah Glass, female    DOB: 1951/01/27  Age: 72 y.o. MRN: [id number]  CC: Cough and Asthma   HPI Deborah Glass presents for f/up -----  She complains of a 10-day history of nonproductive cough and mild wheezing.  She denies fever, chills, night sweats, chest pain, or hemoptysis.  Outpatient Medications Prior to Visit  Medication Sig Dispense Refill   cholecalciferol (VITAMIN D3) 25 MCG (1000 UNIT) tablet Take 1 tablet (1,000 Units total) by mouth daily. 90 tablet 1   Suvorexant (BELSOMRA) 10 MG TABS Take 10 mg by mouth at bedtime as needed. 90 tablet 0   acetaminophen-codeine (TYLENOL #3) 300-30 MG tablet Take 1 tablet by mouth every 6 (six) hours as needed for moderate pain. 15 tablet 0   No facility-administered medications prior to visit.    ROS Review of Systems  Constitutional: Negative.  Negative for appetite change, chills, diaphoresis, fatigue and fever.  HENT: Negative.  Negative for sore throat and trouble swallowing.   Respiratory:  Positive for cough and wheezing. Negative for chest tightness and shortness of breath.   Cardiovascular:  Negative for chest pain, palpitations and leg swelling.  Gastrointestinal:  Negative for abdominal pain, diarrhea, nausea and vomiting.  Genitourinary: Negative.   Musculoskeletal: Negative.   Skin: Negative.   Neurological: Negative.  Negative for dizziness.  Hematological:  Negative for adenopathy. Does not bruise/bleed easily.  Psychiatric/Behavioral: Negative.      Objective:  BP 126/80 (BP Location: Left Arm, Patient Position: Sitting, Cuff Size: Normal)   Pulse 79   Temp 98 F (36.7 C) (Oral)   Ht '4\' 9"'$  (1.448 m)   Wt 106 lb (48.1 kg)   SpO2 92%   BMI 22.94 kg/m   BP Readings from Last 3 Encounters:  05/21/22 126/80  03/31/22 120/74  12/22/21 126/84    Wt Readings from Last 3 Encounters:  05/21/22 106 lb (48.1 kg)  03/31/22 110 lb (49.9 kg)  12/22/21 104 lb (47.2 kg)     Physical Exam Vitals reviewed.  Constitutional:      General: She is not in acute distress.    Appearance: She is not ill-appearing, toxic-appearing or diaphoretic.  HENT:     Nose: Nose normal.     Mouth/Throat:     Mouth: Mucous membranes are moist.  Eyes:     General: No scleral icterus.    Conjunctiva/sclera: Conjunctivae normal.  Cardiovascular:     Rate and Rhythm: Normal rate and regular rhythm.     Heart sounds: No murmur heard. Pulmonary:     Effort: Pulmonary effort is normal. No tachypnea or respiratory distress.     Breath sounds: No stridor. No decreased breath sounds, wheezing, rhonchi or rales.  Abdominal:     General: Abdomen is flat.     Palpations: There is no mass.     Tenderness: There is no abdominal tenderness. There is no guarding.     Hernia: No hernia is present.  Musculoskeletal:        General: Normal range of motion.     Cervical back: Neck supple.     Right lower leg: No edema.     Left lower leg: No edema.  Lymphadenopathy:     Cervical: No cervical adenopathy.  Skin:    General: Skin is warm and dry.  Neurological:     General: No focal deficit present.     Mental Status: She is alert. Mental status is at baseline.  Psychiatric:        Mood and Affect: Mood normal.        Behavior: Behavior normal.     Lab Results  Component Value Date   WBC 3.9 (L) 03/31/2022   HGB 12.6 03/31/2022   HCT 37.4 03/31/2022   PLT 260.0 03/31/2022   GLUCOSE 75 03/31/2022   CHOL 223 (H) 03/31/2022   TRIG 112.0 03/31/2022   HDL 66.50 03/31/2022   LDLCALC 134 (H) 03/31/2022   ALT 10 03/31/2022   AST 20 03/31/2022   NA 142 03/31/2022   K 3.9 03/31/2022   CL 105 03/31/2022   CREATININE 0.70 03/31/2022   BUN 17 03/31/2022   CO2 30 03/31/2022   TSH 3.26 03/31/2022   INR 1.6 (H) 01/21/2021   HGBA1C 5.6 03/31/2022   DG Chest 2 View  Result Date: 05/21/2022 CLINICAL DATA:  cough for 10 days EXAM: CHEST - 2 VIEW COMPARISON:  01/21/2021. FINDINGS:  Cardiac silhouette is unremarkable. No pneumothorax or pleural effusion. The lungs are clear. Aorta is calcified. The visualized skeletal structures are unremarkable. IMPRESSION: No acute cardiopulmonary process. Electronically Signed   By: Sammie Bench M.D.   On: 05/21/2022 15:38     Assessment & Plan:   Tessy was seen today for cough and asthma.  Diagnoses and all orders for this visit:  Subacute cough- Her chest x-ray is negative for mass or infiltrate.  Will treat the wheezing and suppress the cough. -     DG Chest 2 View; Future -     HYDROcodone bit-homatropine (HYCODAN) 5-1.5 MG/5ML syrup; Take 5 mLs by mouth every 8 (eight) hours as needed for up to 8 days for cough.  Mild intermittent asthmatic bronchitis without complication -     albuterol (VENTOLIN HFA) 108 (90 Base) MCG/ACT inhaler; Inhale 2 puffs into the lungs every 6 (six) hours as needed for wheezing or shortness of breath.  Visit for screening mammogram -     Cancel: MM DIGITAL SCREENING BILATERAL; Future -     MM 3D SCREEN BREAST BILATERAL; Future  Estrogen deficiency -     Cancel: DG Bone Density; Future  Viral URI with cough -     HYDROcodone bit-homatropine (HYCODAN) 5-1.5 MG/5ML syrup; Take 5 mLs by mouth every 8 (eight) hours as needed for up to 8 days for cough.   I have discontinued Laylani T. Julson's acetaminophen-codeine. I am also having her start on albuterol and HYDROcodone bit-homatropine. Additionally, I am having her maintain her cholecalciferol and Belsomra.  Meds ordered this encounter  Medications   albuterol (VENTOLIN HFA) 108 (90 Base) MCG/ACT inhaler    Sig: Inhale 2 puffs into the lungs every 6 (six) hours as needed for wheezing or shortness of breath.    Dispense:  18 g    Refill:  3   HYDROcodone bit-homatropine (HYCODAN) 5-1.5 MG/5ML syrup    Sig: Take 5 mLs by mouth every 8 (eight) hours as needed for up to 8 days for cough.    Dispense:  120 mL    Refill:  0     Follow-up: No  follow-ups on file.  Scarlette Calico, MD

## 2022-05-22 ENCOUNTER — Other Ambulatory Visit: Payer: Self-pay | Admitting: Internal Medicine

## 2022-05-22 DIAGNOSIS — E2839 Other primary ovarian failure: Secondary | ICD-10-CM

## 2022-07-03 ENCOUNTER — Ambulatory Visit
Admission: RE | Admit: 2022-07-03 | Discharge: 2022-07-03 | Disposition: A | Discharge status: Home / Self Care | Payer: Medicare Other | Source: Ambulatory Visit | Attending: Internal Medicine | Admitting: Internal Medicine

## 2022-07-03 DIAGNOSIS — Z1231 Encounter for screening mammogram for malignant neoplasm of breast: Secondary | ICD-10-CM

## 2022-11-05 ENCOUNTER — Ambulatory Visit
Admission: RE | Admit: 2022-11-05 | Discharge: 2022-11-05 | Disposition: A | Discharge status: Home / Self Care | Payer: Medicare Other | Source: Ambulatory Visit | Attending: Internal Medicine | Admitting: Internal Medicine

## 2022-11-05 DIAGNOSIS — N958 Other specified menopausal and perimenopausal disorders: Secondary | ICD-10-CM | POA: Diagnosis not present

## 2022-11-05 DIAGNOSIS — E2839 Other primary ovarian failure: Secondary | ICD-10-CM | POA: Diagnosis not present

## 2022-11-05 DIAGNOSIS — M8588 Other specified disorders of bone density and structure, other site: Secondary | ICD-10-CM | POA: Diagnosis not present

## 2022-11-13 ENCOUNTER — Telehealth: Payer: Self-pay

## 2022-11-13 NOTE — Telephone Encounter (Signed)
Evenity VOB initiated via [email or web address] 

## 2022-11-16 NOTE — Telephone Encounter (Signed)
Pt ready for scheduling for EVENITY on or after : 11/16/22  Out-of-pocket cost due at time of visit: $468  Primary: MEDICARE Prolia co-insurance: 20% Admin fee co-insurance: 0%  Secondary: --- Prolia co-insurance:  Admin fee co-insurance:   Medical Benefit Details: Date Benefits were checked: 11/16/22 Deductible: NO/ Coinsurance: 20%/ Admin Fee: 0%  Prior Auth: N/A PA# Expiration Date:   # of doses approved:   ** This summary of benefits is an estimation of the patient's out-of-pocket cost. Exact cost may very based on individual plan coverage.

## 2022-11-24 ENCOUNTER — Telehealth: Payer: Self-pay | Admitting: Pharmacy Technician

## 2022-11-24 ENCOUNTER — Other Ambulatory Visit: Payer: Self-pay | Admitting: Internal Medicine

## 2022-11-24 DIAGNOSIS — M810 Age-related osteoporosis without current pathological fracture: Secondary | ICD-10-CM | POA: Insufficient documentation

## 2022-11-24 NOTE — Telephone Encounter (Signed)
-----   Message from Sanda Linger sent at 11/24/2022  8:56 AM EDT ----- Burnell Blanks ---  Will you check her benefits for evenity?  Sanda Linger, MD

## 2022-11-24 NOTE — Telephone Encounter (Signed)
Please see encounter on 11/13/22

## 2022-12-03 ENCOUNTER — Other Ambulatory Visit: Payer: Self-pay | Admitting: Internal Medicine

## 2022-12-16 ENCOUNTER — Other Ambulatory Visit: Payer: Self-pay

## 2022-12-16 ENCOUNTER — Telehealth: Payer: Self-pay | Admitting: Internal Medicine

## 2022-12-16 ENCOUNTER — Other Ambulatory Visit: Payer: Self-pay | Admitting: Internal Medicine

## 2022-12-16 DIAGNOSIS — M810 Age-related osteoporosis without current pathological fracture: Secondary | ICD-10-CM

## 2022-12-16 IMAGING — XA DG C-ARM 1-60 MIN
4 series · 6 of 6 positions shown · non-contrast
Comparison: Abdomen pelvis CT earlier this day.

CLINICAL DATA: Cystoscopy with stent replacement, right.

EXAM:
DG C-ARM 1-60 MIN
FLUOROSCOPY TIME:  Fluoroscopy Time:  1 minute 54 seconds
Radiation Exposure Index (if provided by the fluoroscopic device):
Not provided.
Number of Acquired Spot Images: 4

[Series 1: uro standard · 1 of 1 slices shown (1 of 4)]
[im 1/1]
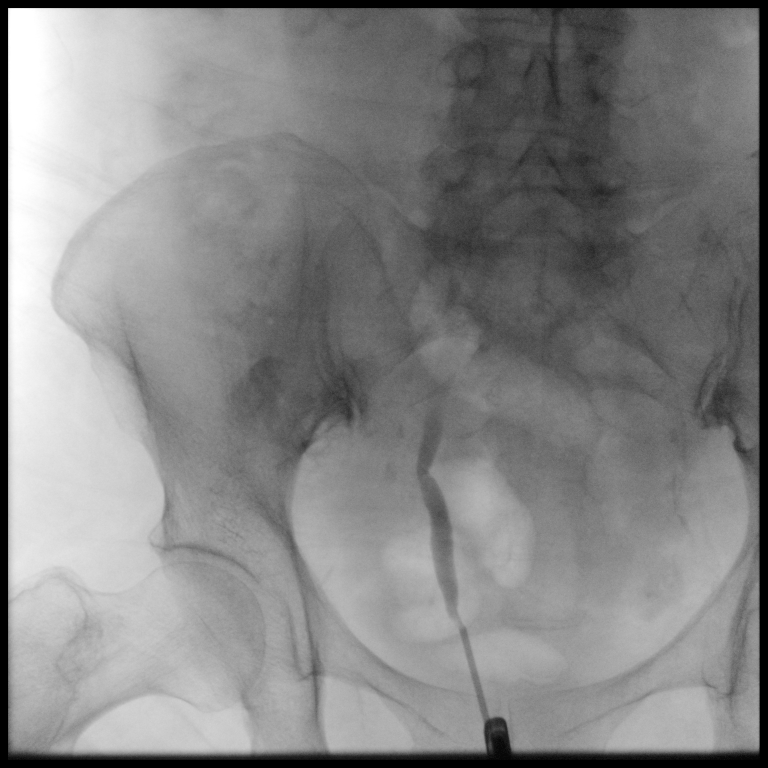

[Series 3: uro standard · 3 of 3 slices shown (2 of 4)]
[im 1/3]
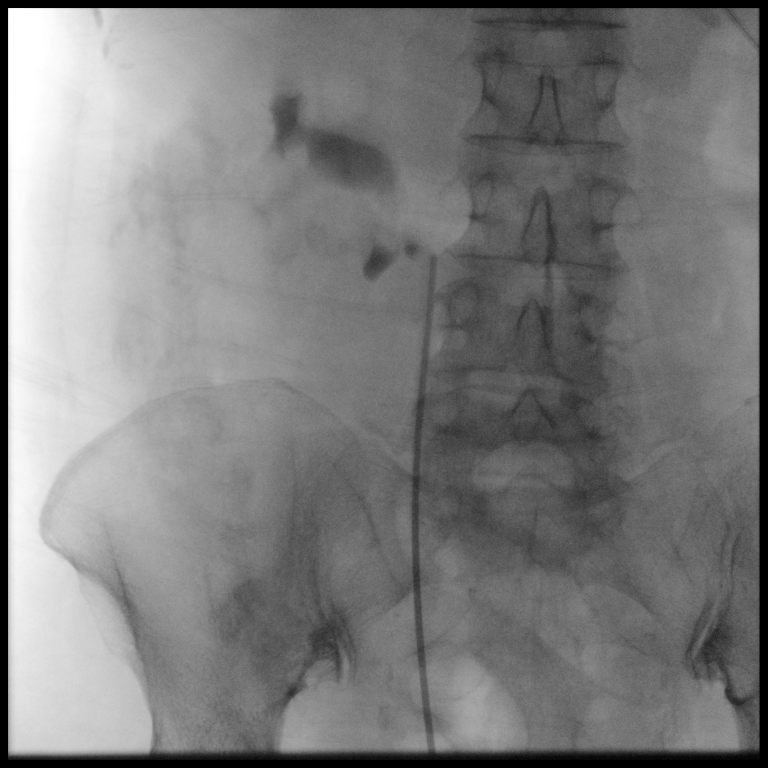
[im 2/3]
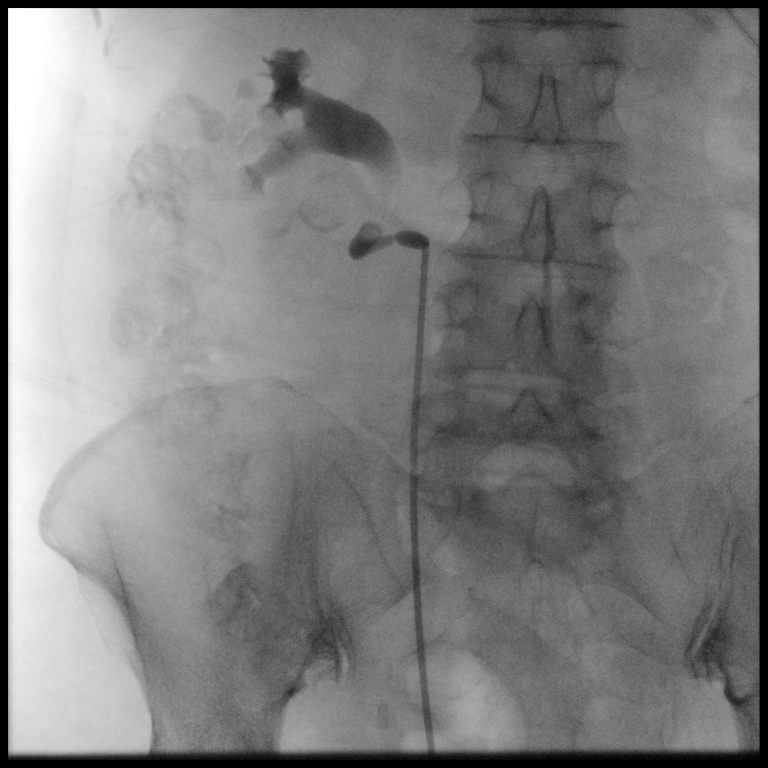
[im 3/3]
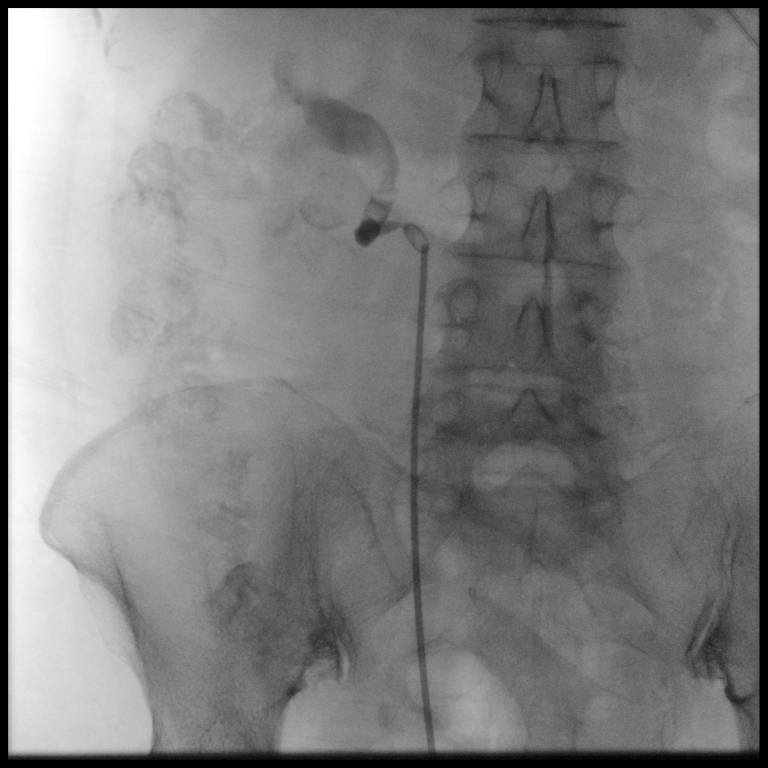

[Series 5: uro standard · 1 of 1 slices shown (3 of 4)]
[im 1/1]
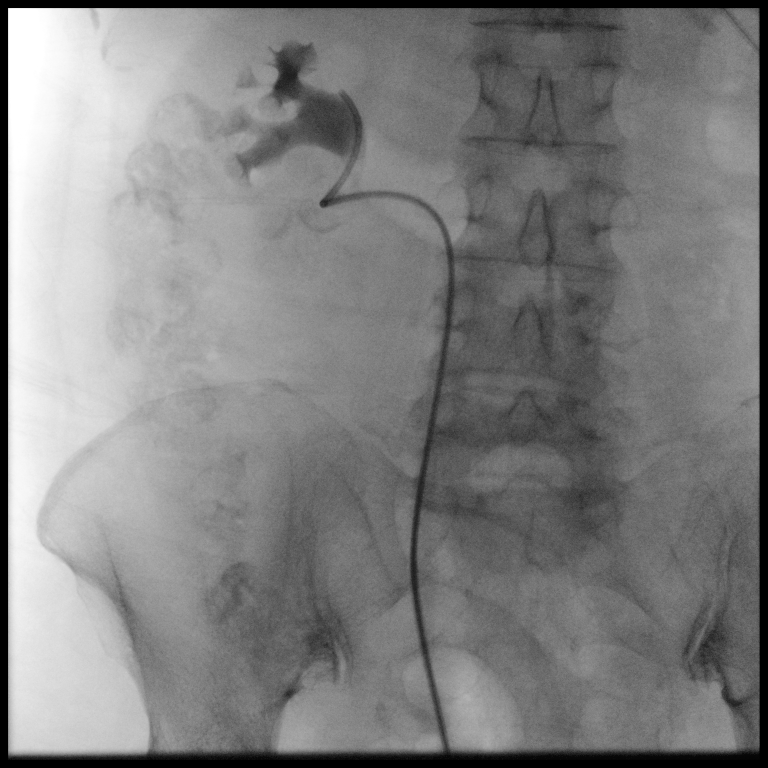

[Series 6: uro standard · 1 of 1 slices shown (4 of 4)]
[im 1/1]
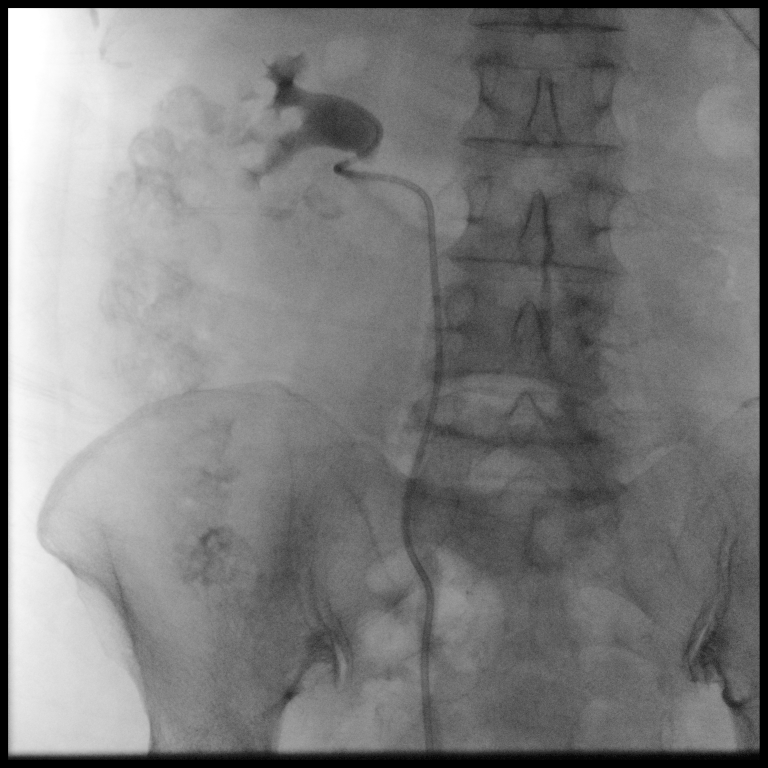

[6 of 6 positions shown; findings below may reference images not displayed]

FINDINGS: Four fluoroscopic spot views obtained during cystoscopy and right
ureteral stent placement. Contrast ejected into the right ureter
demonstrates dilatation of the renal collecting system and ureter.
The stone on prior CT is not well seen on provided views. Ureteral
stent is present on the final images.
IMPRESSION: Procedural fluoroscopy during cystoscopy and right ureteral stent
placement. Correlate with operative report for details.

## 2022-12-16 IMAGING — DX DG CHEST 1V PORT
1 series · 1 of 1 positions shown · non-contrast
Comparison: 11/01/2019

CLINICAL DATA: Fever

EXAM:
PORTABLE CHEST 1 VIEW

[chest ap]
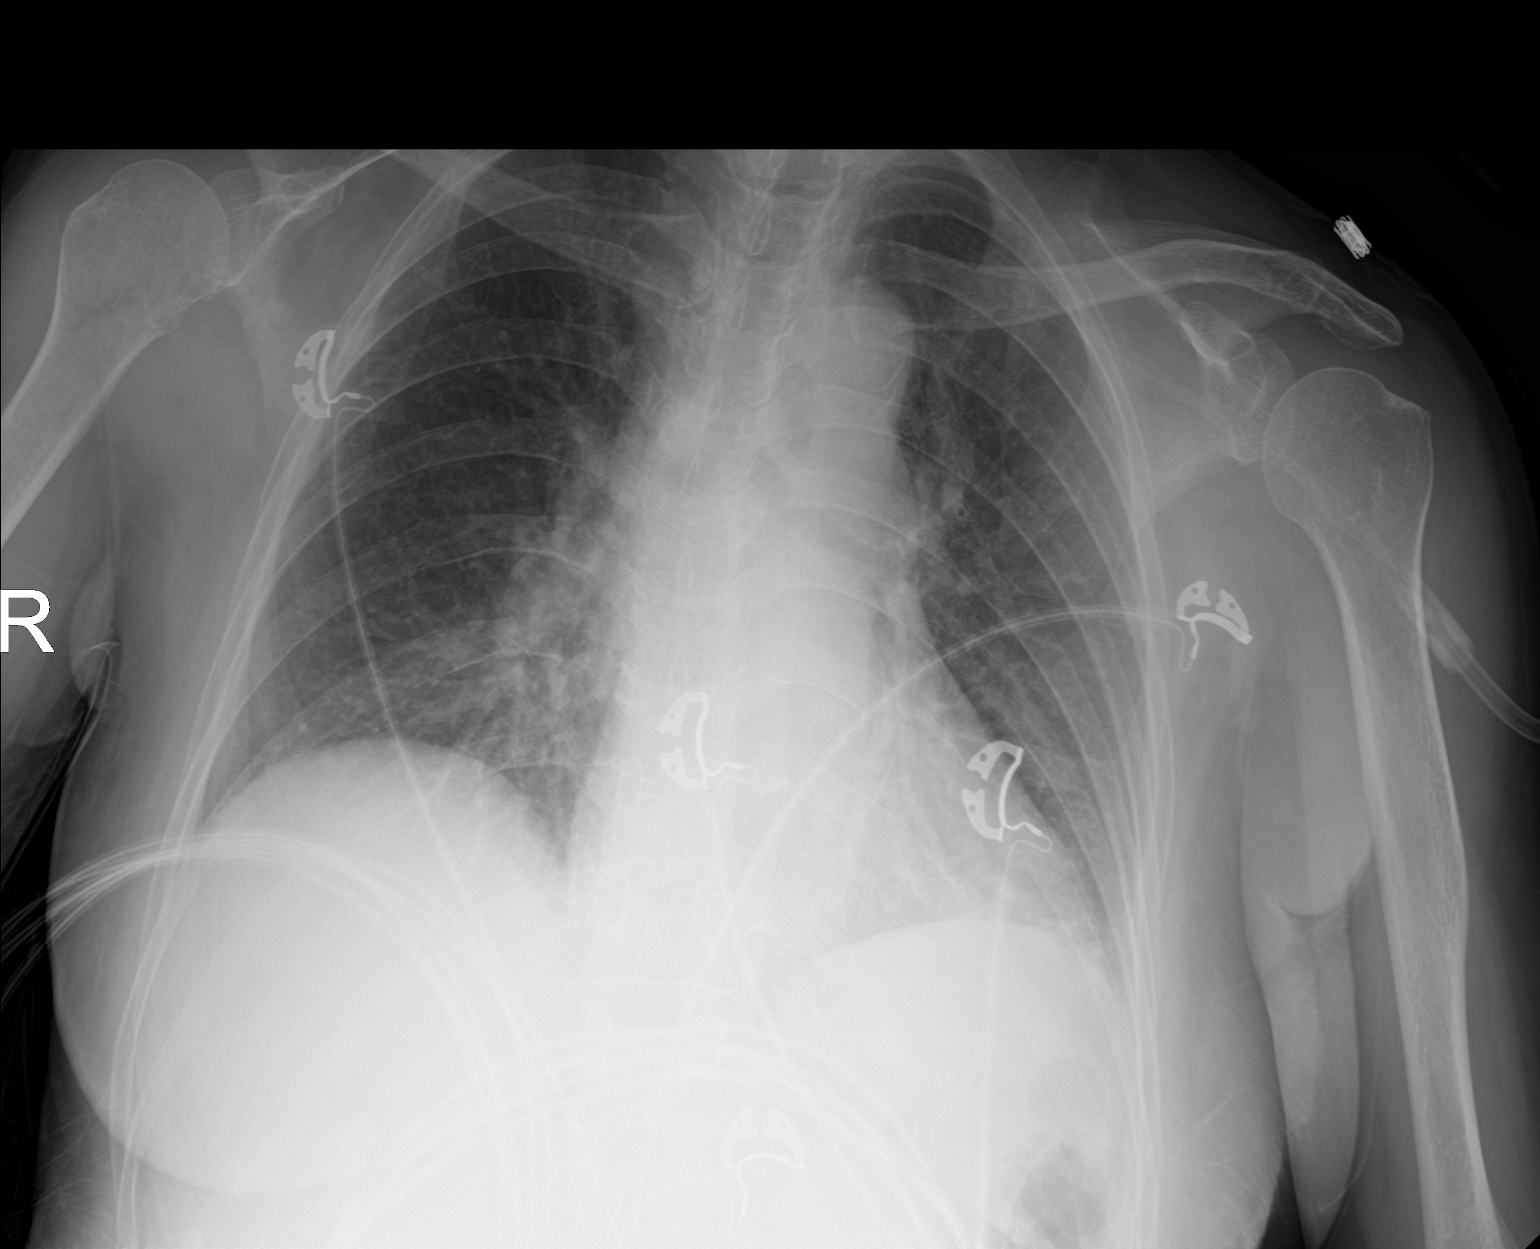

[1 of 1 positions shown; findings below may reference images not displayed]

FINDINGS: Cardiac size is unremarkable. Central pulmonary vessels are
prominent. There is poor inspiration. Interstitial markings in the
parahilar regions and lower lung fields are prominent. There is no
focal consolidation. There is no pleural effusion or pneumothorax.
IMPRESSION: Central pulmonary vessels are prominent without signs of alveolar
pulmonary edema. There is prominence of interstitial markings in the
parahilar regions and lower lung fields suggesting bronchitis or
interstitial pneumonitis.

## 2022-12-16 MED ORDER — DENOSUMAB 60 MG/ML ~~LOC~~ SOSY
60.0000 mg | PREFILLED_SYRINGE | Freq: Once | SUBCUTANEOUS | 0 refills | Status: AC
  Filled 2022-12-16: qty 1, 1d supply, fill #0

## 2022-12-16 NOTE — Telephone Encounter (Signed)
[facility] called and said patient needs a prior authorization for prolia.

## 2022-12-16 NOTE — Progress Notes (Addendum)
Benefits Investigation Summary Prescription: New Therapy-Prolia Dispensing pharmacy: [facility]  Copay amount: To be determined Prior Auth and Med Assistance Notes: Prior authorization required. Called provider and spoke with Denny Peon who will route to PA team    :

## 2022-12-17 IMAGING — DX DG CHEST 1V PORT
1 series · 1 of 1 positions shown · non-contrast
Comparison: Previous studies including the examination of
01/20/2021

CLINICAL DATA: Shortness of breath

EXAM:
PORTABLE CHEST 1 VIEW

[chest ap]
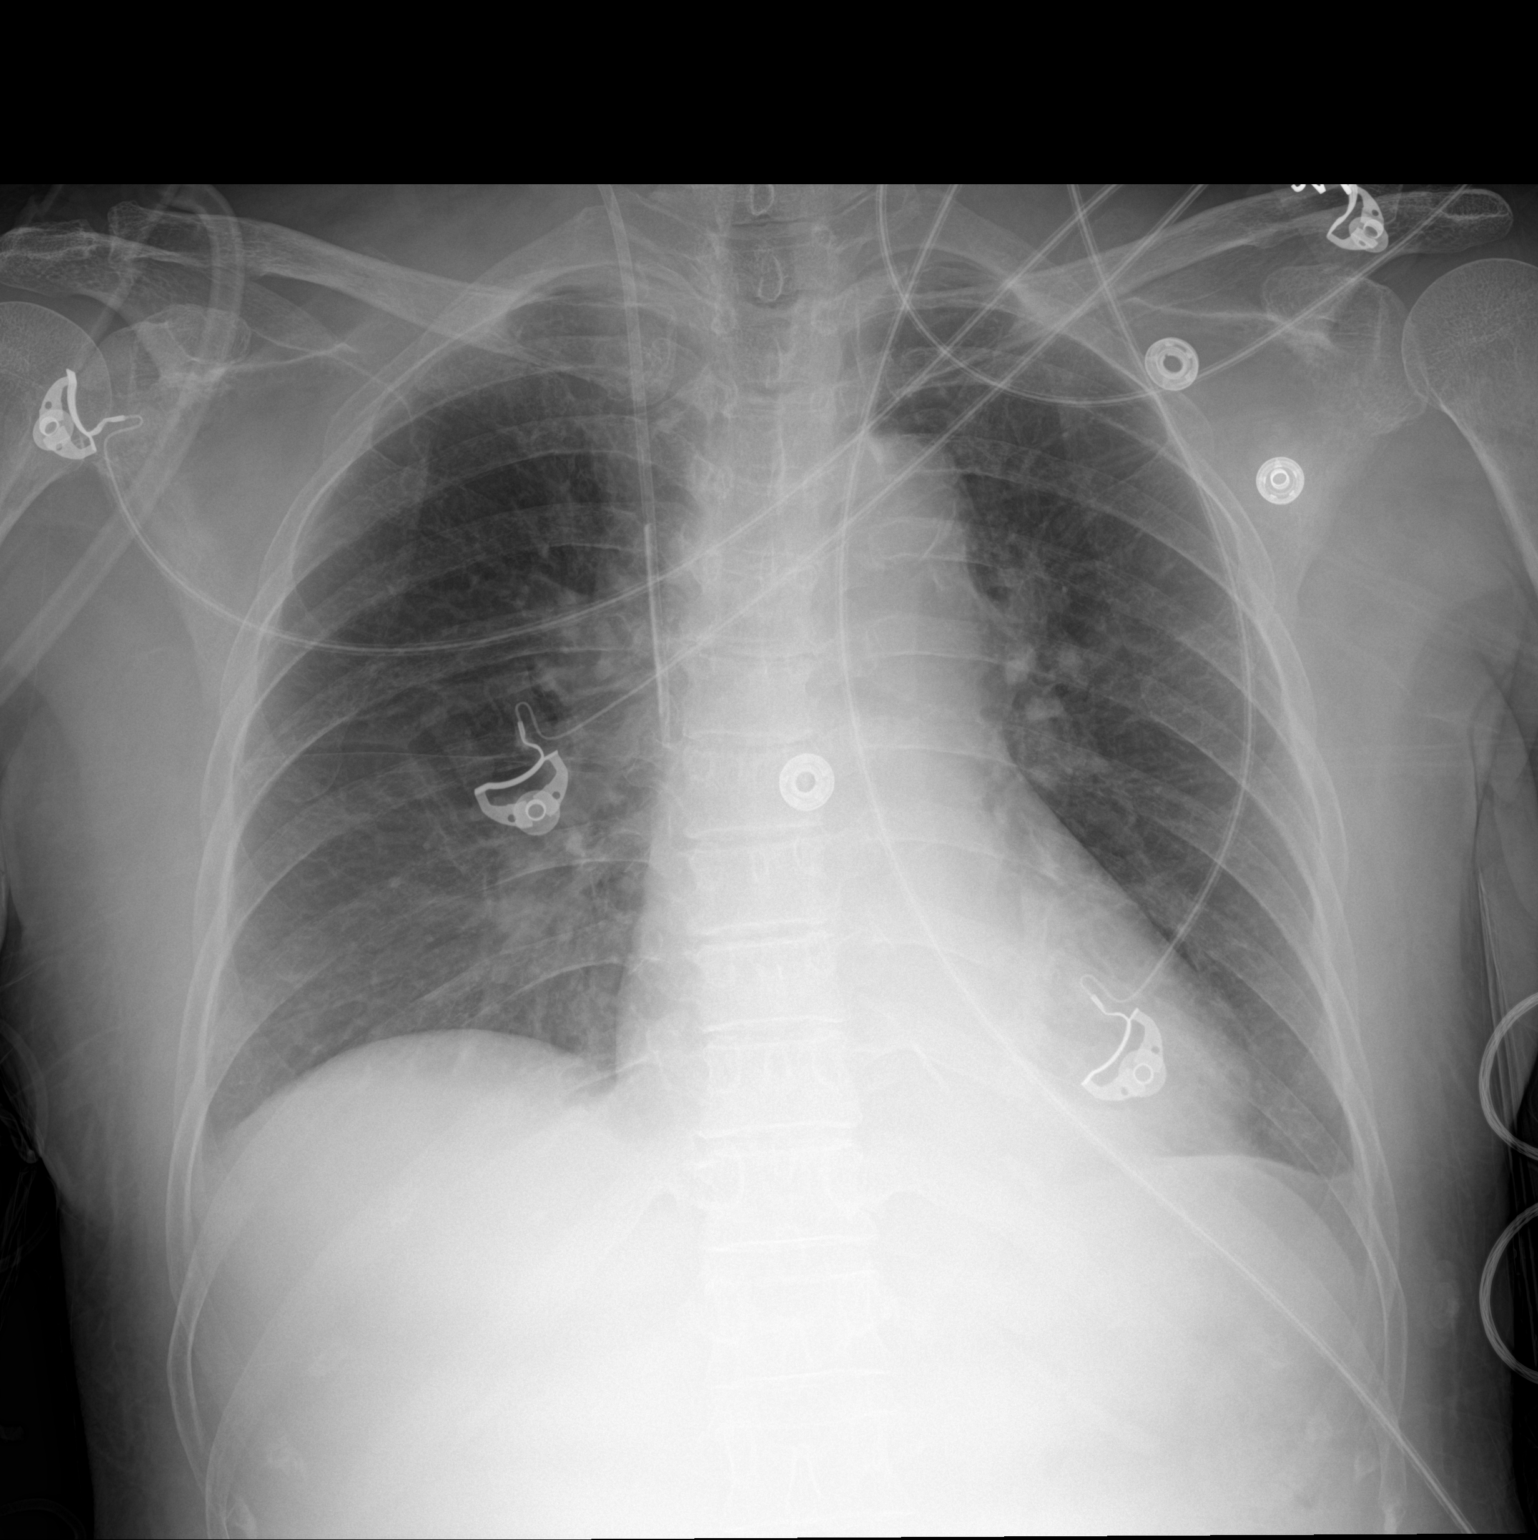

[1 of 1 positions shown; findings below may reference images not displayed]

FINDINGS: Cardiac size is within normal limits. There are no signs of
pulmonary edema. Increased markings are seen in the medial left
lower lung fields with no significant interval change. There is
minimal blunting of left lateral CP angle. There is no pneumothorax.
Tip of central venous catheter is seen in superior vena cava.
IMPRESSION: Increased markings in the medial left lower lung fields may suggest
crowding of bronchovascular structures or subsegmental atelectasis.

## 2022-12-18 ENCOUNTER — Other Ambulatory Visit (HOSPITAL_COMMUNITY): Payer: Self-pay

## 2022-12-18 ENCOUNTER — Other Ambulatory Visit: Payer: Self-pay

## 2022-12-18 ENCOUNTER — Telehealth: Payer: Self-pay

## 2022-12-18 NOTE — Telephone Encounter (Signed)
Pharmacy Patient Advocate Encounter   Received notification from Pt Calls Messages that prior authorization for Prolia is required/requested for pharmacy benefit.   Insurance verification completed.   The patient is insured through [facility] .   Per test claim: PA required; PA started via CoverMyMeds. KEY [id number] . Waiting for clinical questions to populate.

## 2022-12-21 NOTE — Telephone Encounter (Signed)
 Clinical questions answered and PA submitted

## 2022-12-21 NOTE — Telephone Encounter (Signed)
Pharmacy Patient Advocate Encounter  Received notification from [facility] that Prior Authorization for Prolia has been DENIED.  See denial reason below. No denial letter attached in CMM. Will attache denial letter to Media tab once received.   PA #/Case ID/Reference #: [id number]

## 2022-12-22 ENCOUNTER — Other Ambulatory Visit: Payer: Self-pay

## 2022-12-23 ENCOUNTER — Other Ambulatory Visit: Payer: Self-pay

## 2022-12-25 ENCOUNTER — Other Ambulatory Visit: Payer: Self-pay | Admitting: Internal Medicine

## 2022-12-25 ENCOUNTER — Other Ambulatory Visit: Payer: Self-pay

## 2022-12-25 DIAGNOSIS — M810 Age-related osteoporosis without current pathological fracture: Secondary | ICD-10-CM

## 2022-12-25 NOTE — Progress Notes (Signed)
PA denied. Office has advised provider.

## 2022-12-25 NOTE — Telephone Encounter (Signed)
[facility] pharmacy called on behalf of the patient to see what her options are for osteoporosis treatment as the PA was denied. Please advise.

## 2023-01-05 ENCOUNTER — Ambulatory Visit: Payer: Medicare Other | Admitting: Internal Medicine

## 2023-08-09 ENCOUNTER — Ambulatory Visit: Payer: Medicare (Managed Care) | Admitting: Family Medicine

## 2023-08-09 ENCOUNTER — Encounter: Payer: Self-pay | Admitting: Family Medicine

## 2023-08-09 VITALS — BP 136/84 | HR 52 | Temp 97.6°F | Ht <= 58 in | Wt 102.4 lb

## 2023-08-09 DIAGNOSIS — R110 Nausea: Secondary | ICD-10-CM | POA: Insufficient documentation

## 2023-08-09 DIAGNOSIS — Z79899 Other long term (current) drug therapy: Secondary | ICD-10-CM | POA: Diagnosis not present

## 2023-08-09 DIAGNOSIS — E785 Hyperlipidemia, unspecified: Secondary | ICD-10-CM | POA: Diagnosis not present

## 2023-08-09 DIAGNOSIS — Z6822 Body mass index (BMI) 22.0-22.9, adult: Secondary | ICD-10-CM | POA: Insufficient documentation

## 2023-08-09 LAB — CBC WITH DIFFERENTIAL/PLATELET
Basophils Absolute: 0 10*3/uL (ref 0.0–0.1)
Basophils Relative: 0.4 % (ref 0.0–3.0)
Eosinophils Absolute: 0.4 10*3/uL (ref 0.0–0.7)
Eosinophils Relative: 7.9 % — ABNORMAL HIGH (ref 0.0–5.0)
HCT: 37.5 % (ref 36.0–46.0)
Hemoglobin: 12.6 g/dL (ref 12.0–15.0)
Lymphocytes Relative: 41.2 % (ref 12.0–46.0)
Lymphs Abs: 2.1 10*3/uL (ref 0.7–4.0)
MCHC: 33.7 g/dL (ref 30.0–36.0)
MCV: 91 fl (ref 78.0–100.0)
Monocytes Absolute: 0.4 10*3/uL (ref 0.1–1.0)
Monocytes Relative: 7.7 % (ref 3.0–12.0)
Neutro Abs: 2.2 10*3/uL (ref 1.4–7.7)
Neutrophils Relative %: 42.8 % — ABNORMAL LOW (ref 43.0–77.0)
Platelets: 258 10*3/uL (ref 150.0–400.0)
RBC: 4.12 Mil/uL (ref 3.87–5.11)
RDW: 13.7 % (ref 11.5–15.5)
WBC: 5.2 10*3/uL (ref 4.0–10.5)

## 2023-08-09 LAB — COMPREHENSIVE METABOLIC PANEL WITH GFR
ALT: 12 U/L (ref 0–35)
AST: 20 U/L (ref 0–37)
Albumin: 4.3 g/dL (ref 3.5–5.2)
Alkaline Phosphatase: 65 U/L (ref 39–117)
BUN: 14 mg/dL (ref 6–23)
CO2: 31 meq/L (ref 19–32)
Calcium: 9.3 mg/dL (ref 8.4–10.5)
Chloride: 105 meq/L (ref 96–112)
Creatinine, Ser: 0.67 mg/dL (ref 0.40–1.20)
GFR: 85.79 mL/min (ref >60.00)
Glucose, Bld: 85 mg/dL (ref 70–99)
Potassium: 3.9 meq/L (ref 3.5–5.1)
Sodium: 141 meq/L (ref 135–145)
Total Bilirubin: 0.4 mg/dL (ref 0.2–1.2)
Total Protein: 7.6 g/dL (ref 6.0–8.3)

## 2023-08-09 LAB — LIPID PANEL
Cholesterol: 250 mg/dL — ABNORMAL HIGH (ref 0–200)
HDL: 81.5 mg/dL (ref >39.00)
LDL Cholesterol: 151 mg/dL — ABNORMAL HIGH (ref 0–99)
NonHDL: 168.84
Total CHOL/HDL Ratio: 3
Triglycerides: 90 mg/dL (ref 0.0–149.0)
VLDL: 18 mg/dL (ref 0.0–40.0)

## 2023-08-09 NOTE — Progress Notes (Signed)
   Acute Office Visit  Subjective:     Patient ID: Deborah Glass, female    DOB: Dec 27, 1950, 73 y.o.   MRN: [id number]  Chief Complaint  Patient presents with   Acute Visit    Nausea, bothered her for about 3 days, does feel better today.    HPI Patient is in today for evaluation of nausea for the last 3 days with headache. Reports that she has been progressively getting better and symptoms have since resolved. Has hx GERD with esophageal stricture. She is fasting today. Would like to check labs for medication management while she is here today. Denies known sick contacts.  Denies other concerns.  ROS Per HPI      Objective:    BP 136/84 (BP Location: Right Arm, Patient Position: Sitting, Cuff Size: Small)   Pulse (!) 52   Temp 97.6 F (36.4 C) (Temporal)   Ht 4\' 9"  (1.448 m)   Wt 102 lb 6.4 oz (46.4 kg)   SpO2 100%   BMI 22.16 kg/m    Physical Exam Vitals and nursing note reviewed.  Constitutional:      General: She is not in acute distress.    Appearance: Normal appearance. She is normal weight.  HENT:     Head: Normocephalic and atraumatic.     Right Ear: External ear normal.     Left Ear: External ear normal.     Nose: Nose normal.     Mouth/Throat:     Mouth: Mucous membranes are moist.     Pharynx: Oropharynx is clear.  Eyes:     Extraocular Movements: Extraocular movements intact.     Pupils: Pupils are equal, round, and reactive to light.  Cardiovascular:     Rate and Rhythm: Normal rate and regular rhythm.     Pulses: Normal pulses.     Heart sounds: Normal heart sounds.  Pulmonary:     Effort: Pulmonary effort is normal. No respiratory distress.     Breath sounds: Normal breath sounds. No wheezing, rhonchi or rales.  Musculoskeletal:        General: Normal range of motion.     Cervical back: Normal range of motion.     Right lower leg: No edema.     Left lower leg: No edema.  Lymphadenopathy:     Cervical: No cervical adenopathy.   Neurological:     General: No focal deficit present.     Mental Status: She is alert and oriented to person, place, and time.  Psychiatric:        Mood and Affect: Mood normal.        Thought Content: Thought content normal.    No results found for any visits on 08/09/23.      Assessment & Plan:   Hyperlipidemia with target LDL less than 130 -     Lipid panel  Nausea -     CBC with Differential/Platelet -     Comprehensive metabolic panel with GFR  Medication management -     CBC with Differential/Platelet -     Comprehensive metabolic panel with GFR -     Lipid panel  BMI 22.0-22.9, adult Assessment & Plan: Continue efforts in healthy diet and activity level     No orders of the defined types were placed in this encounter.   Return if symptoms worsen or fail to improve.  Wellington Half, FNP

## 2023-08-09 NOTE — Assessment & Plan Note (Signed)
 Continue efforts in healthy diet and activity level

## 2023-08-09 NOTE — Patient Instructions (Signed)
 We are checking labs today, will be in contact with any results that require further attention  Follow-up with me for new or worsening symptoms.

## 2023-08-10 ENCOUNTER — Ambulatory Visit: Payer: Self-pay | Admitting: Family Medicine

## 2023-08-18 ENCOUNTER — Other Ambulatory Visit: Payer: Self-pay | Admitting: Internal Medicine

## 2023-08-18 DIAGNOSIS — Z1231 Encounter for screening mammogram for malignant neoplasm of breast: Secondary | ICD-10-CM

## 2023-09-16 ENCOUNTER — Ambulatory Visit
Admission: RE | Admit: 2023-09-16 | Discharge: 2023-09-16 | Disposition: A | Discharge status: Home / Self Care | Payer: Medicare (Managed Care) | Source: Ambulatory Visit | Attending: Internal Medicine | Admitting: Internal Medicine

## 2023-09-16 DIAGNOSIS — Z1231 Encounter for screening mammogram for malignant neoplasm of breast: Secondary | ICD-10-CM | POA: Diagnosis not present

## 2023-12-07 ENCOUNTER — Other Ambulatory Visit (HOSPITAL_COMMUNITY): Payer: Self-pay

## 2023-12-18 ENCOUNTER — Encounter (HOSPITAL_COMMUNITY): Payer: Self-pay | Admitting: Emergency Medicine

## 2023-12-18 ENCOUNTER — Ambulatory Visit (HOSPITAL_COMMUNITY)
Admission: EM | Admit: 2023-12-18 | Discharge: 2023-12-18 | Disposition: A | Discharge status: Home / Self Care | Payer: Medicare (Managed Care)

## 2023-12-18 DIAGNOSIS — R112 Nausea with vomiting, unspecified: Secondary | ICD-10-CM

## 2023-12-18 DIAGNOSIS — K529 Noninfective gastroenteritis and colitis, unspecified: Secondary | ICD-10-CM

## 2023-12-18 MED ORDER — ONDANSETRON 4 MG PO TBDP: 4.0000 mg | ORAL_TABLET | Freq: Three times a day (TID) | ORAL | 0 refills | Status: AC | PRN

## 2023-12-18 MED ORDER — ONDANSETRON 4 MG PO TBDP
ORAL_TABLET | ORAL | Status: AC
  Filled 2023-12-18: qty 1

## 2023-12-18 MED ORDER — ONDANSETRON 4 MG PO TBDP
4.0000 mg | ORAL_TABLET | Freq: Once | ORAL | Status: AC
  Administered 2023-12-18: 4 mg via ORAL

## 2023-12-18 NOTE — Discharge Instructions (Signed)
  1. Gastroenteritis (Primary) 2. Nausea and vomiting, unspecified vomiting type - ondansetron  (ZOFRAN -ODT) disintegrating tablet 4 mg given in UC for acute nausea and vomiting - ondansetron  (ZOFRAN -ODT) 4 MG disintegrating tablet; Take 1 tablet (4 mg total) by mouth every 8 (eight) hours as needed for nausea or vomiting.  Dispense: 20 tablet; Refill: 0 - Drink plenty of fluids, get plenty of rest, eat a light diet and try to manage symptoms at home. - If symptoms are not improved by tomorrow or if they become worse follow-up in emergency department immediately for further evaluation and treatment.

## 2023-12-18 NOTE — ED Provider Notes (Signed)
 [facility]  Note:  This document was prepared using Conservation officer, historic buildings and may include unintentional dictation errors.  MRN: [id number] DOB: 1950/09/15  Subjective:   Deborah Glass is a 73 y.o. female presenting for evaluation of right upper quadrant abdominal pain, chills, nausea/vomiting and headache since Tuesday.  Patient denies any known sick exposures.  Has been taking Tylenol  and Advil  with minimal improvement.  Patient denies any known fever, shortness of breath, chest pain, weakness, dizziness.  Patient has not taken any other over-the-counter medication to treat symptoms.  No current facility-administered medications for this encounter.  Current Outpatient Medications:    ondansetron  (ZOFRAN -ODT) 4 MG disintegrating tablet, Take 1 tablet (4 mg total) by mouth every 8 (eight) hours as needed for nausea or vomiting., Disp: 20 tablet, Rfl: 0   albuterol  (VENTOLIN  HFA) 108 (90 Base) MCG/ACT inhaler, Inhale 2 puffs into the lungs every 6 (six) hours as needed for wheezing or shortness of breath., Disp: 18 g, Rfl: 3   cholecalciferol (VITAMIN D3) 25 MCG (1000 UNIT) tablet, Take 1 tablet (1,000 Units total) by mouth daily., Disp: 90 tablet, Rfl: 1   Suvorexant  (BELSOMRA ) 10 MG TABS, Take 10 mg by mouth at bedtime as needed. (Patient not taking: Reported on 08/09/2023), Disp: 90 tablet, Rfl: 0   No Known Allergies  History reviewed. No pertinent past medical history.   Past Surgical History:  Procedure Laterality Date   BREAST EXCISIONAL BIOPSY Right    CYSTOSCOPY W/ URETERAL STENT PLACEMENT Right 01/20/2021   Procedure: CYSTOSCOPY WITH STENT REPLACEMENT, RETROGRADE;  Surgeon: Watt Rush, MD;  Location: [facility];  Service: Urology;  Laterality: Right;    Family History  Problem Relation Age of Onset   Stomach cancer Father    BRCA 1/2 Neg Hx    Breast cancer Neg Hx     Social History   Tobacco Use   Smoking status: Never   Smokeless tobacco:  Never  Substance Use Topics   Alcohol use: No   Drug use: No    ROS Refer to HPI for ROS details.  Objective:    Vitals: BP 117/75 (BP Location: Right Arm)   Pulse 86   Temp 100 F (37.8 C) (Oral)   Resp 19   SpO2 93%   Physical Exam Vitals and nursing note reviewed.  Constitutional:      General: She is not in acute distress.    Appearance: Normal appearance. She is well-developed. She is not ill-appearing or toxic-appearing.  HENT:     Head: Normocephalic and atraumatic.     Mouth/Throat:     Mouth: Mucous membranes are moist.  Cardiovascular:     Rate and Rhythm: Normal rate.  Pulmonary:     Effort: Pulmonary effort is normal. No respiratory distress.  Abdominal:     Palpations: Abdomen is soft.     Tenderness: There is no abdominal tenderness.  Musculoskeletal:        General: Normal range of motion.  Skin:    General: Skin is warm and dry.  Neurological:     General: No focal deficit present.     Mental Status: She is alert and oriented to person, place, and time.  Psychiatric:        Mood and Affect: Mood normal.        Behavior: Behavior normal.     Procedures  No results found for this or any previous visit (from the past 24 hours).  Assessment and Plan :  Discharge Instructions       1. Gastroenteritis (Primary) 2. Nausea and vomiting, unspecified vomiting type - ondansetron  (ZOFRAN -ODT) disintegrating tablet 4 mg given in UC for acute nausea and vomiting - ondansetron  (ZOFRAN -ODT) 4 MG disintegrating tablet; Take 1 tablet (4 mg total) by mouth every 8 (eight) hours as needed for nausea or vomiting.  Dispense: 20 tablet; Refill: 0 - Drink plenty of fluids, get plenty of rest, eat a light diet and try to manage symptoms at home. - If symptoms are not improved by tomorrow or if they become worse follow-up in emergency department immediately for further evaluation and treatment.      Qais Jowers B Sula Fetterly   Zya Finkle, [place] B,  [place] 12/18/23 [phone]

## 2023-12-18 NOTE — ED Triage Notes (Signed)
 Pt reports Tuesday having abd pain, vomiting, and chills. Pt also c/o headaches as well. Took tylenol  and advil .

## 2023-12-21 ENCOUNTER — Other Ambulatory Visit: Payer: Self-pay

## 2023-12-21 ENCOUNTER — Emergency Department (HOSPITAL_COMMUNITY): Payer: Medicare (Managed Care)

## 2023-12-21 ENCOUNTER — Emergency Department (HOSPITAL_COMMUNITY)
Admission: EM | Admit: 2023-12-21 | Discharge: 2023-12-22 | Disposition: A | Discharge status: Home / Self Care | Payer: Medicare (Managed Care) | Source: Ambulatory Visit | Attending: Emergency Medicine | Admitting: Emergency Medicine

## 2023-12-21 DIAGNOSIS — R112 Nausea with vomiting, unspecified: Secondary | ICD-10-CM | POA: Insufficient documentation

## 2023-12-21 DIAGNOSIS — R519 Headache, unspecified: Secondary | ICD-10-CM | POA: Diagnosis not present

## 2023-12-21 LAB — COMPREHENSIVE METABOLIC PANEL WITH GFR
ALT: 7 U/L (ref 0–44)
AST: 25 U/L (ref 15–41)
Albumin: 3.6 g/dL (ref 3.5–5.0)
Alkaline Phosphatase: 63 U/L (ref 38–126)
Anion gap: 10 (ref 5–15)
BUN: 8 mg/dL (ref 8–23)
CO2: 24 mmol/L (ref 22–32)
Calcium: 8.6 mg/dL — ABNORMAL LOW (ref 8.9–10.3)
Chloride: 103 mmol/L (ref 98–111)
Creatinine, Ser: 0.52 mg/dL (ref 0.44–1.00)
GFR, Estimated: >60 mL/min (ref >60)
Glucose, Bld: 113 mg/dL — ABNORMAL HIGH (ref 70–99)
Potassium: 3.9 mmol/L (ref 3.5–5.1)
Sodium: 136 mmol/L (ref 135–145)
Total Bilirubin: 0.3 mg/dL (ref 0.0–1.2)
Total Protein: 6.4 g/dL — ABNORMAL LOW (ref 6.5–8.1)

## 2023-12-21 LAB — URINALYSIS, ROUTINE W REFLEX MICROSCOPIC
Bacteria, UA: NONE SEEN
Bilirubin Urine: NEGATIVE
Glucose, UA: NEGATIVE mg/dL
Hgb urine dipstick: NEGATIVE
Ketones, ur: 20 mg/dL — AB
Nitrite: NEGATIVE
Protein, ur: NEGATIVE mg/dL
Specific Gravity, Urine: 1.008 (ref 1.005–1.030)
pH: 7 (ref 5.0–8.0)

## 2023-12-21 LAB — CBC
HCT: 36.4 % (ref 36.0–46.0)
Hemoglobin: 11.5 g/dL — ABNORMAL LOW (ref 12.0–15.0)
MCH: 29.3 pg (ref 26.0–34.0)
MCHC: 31.6 g/dL (ref 30.0–36.0)
MCV: 92.6 fL (ref 80.0–100.0)
Platelets: 267 K/uL (ref 150–400)
RBC: 3.93 MIL/uL (ref 3.87–5.11)
RDW: 12.9 % (ref 11.5–15.5)
WBC: 4.1 K/uL (ref 4.0–10.5)
nRBC: 0 % (ref 0.0–0.2)

## 2023-12-21 LAB — LIPASE, BLOOD: Lipase: 45 U/L (ref 11–51)

## 2023-12-21 MED ORDER — KETOROLAC TROMETHAMINE 15 MG/ML IJ SOLN
15.0000 mg | Freq: Once | INTRAMUSCULAR | Status: AC
  Administered 2023-12-21: 15 mg via INTRAVENOUS
  Filled 2023-12-21: qty 1

## 2023-12-21 MED ORDER — SODIUM CHLORIDE 0.9 % IV BOLUS
500.0000 mL | Freq: Once | INTRAVENOUS | Status: AC
  Administered 2023-12-21: 500 mL via INTRAVENOUS

## 2023-12-21 MED ORDER — MORPHINE SULFATE (PF) 2 MG/ML IV SOLN
2.0000 mg | Freq: Once | INTRAVENOUS | Status: AC
  Administered 2023-12-22: 2 mg via INTRAVENOUS
  Filled 2023-12-21: qty 1

## 2023-12-21 MED ORDER — DIPHENHYDRAMINE HCL 50 MG/ML IJ SOLN
12.5000 mg | Freq: Once | INTRAMUSCULAR | Status: AC
  Administered 2023-12-21: 12.5 mg via INTRAVENOUS
  Filled 2023-12-21: qty 1

## 2023-12-21 MED ORDER — METOCLOPRAMIDE HCL 5 MG/ML IJ SOLN
5.0000 mg | Freq: Once | INTRAMUSCULAR | Status: AC
  Administered 2023-12-21: 5 mg via INTRAVENOUS
  Filled 2023-12-21: qty 2

## 2023-12-21 MED ORDER — DEXAMETHASONE SODIUM PHOSPHATE 10 MG/ML IJ SOLN
10.0000 mg | Freq: Once | INTRAMUSCULAR | Status: AC
Start: 2023-12-21 — End: 2023-12-22
  Administered 2023-12-22: 10 mg via INTRAVENOUS
  Filled 2023-12-21: qty 1

## 2023-12-21 MED ORDER — IOHEXOL 350 MG/ML SOLN
75.0000 mL | Freq: Once | INTRAVENOUS | Status: AC | PRN
  Administered 2023-12-21: 75 mL via INTRAVENOUS

## 2023-12-21 NOTE — ED Provider Notes (Signed)
 [place] EMERGENCY DEPARTMENT AT [facility] Provider Note   CSN: [id number] Arrival date & time: 12/21/23  1336     Patient presents with: Emesis   Deborah Glass is a 73 y.o. female.  {Add pertinent medical, surgical, social history, OB history to HPI:32960} HPI   73 year old [place] speaking female presents emergency department with persistent headache, nausea/vomiting.  Patient does report history of migraines/headaches that she takes at home medication for.  However this current headache has not been responding to that medication.  She reports this headache ongoing for the past week, present at night.  No associated neck pain or stiffness.  The initial onset was gradual, the headache is diffuse.  She at times feels like she has blurry vision but currently has no visual disturbance or focal neurologic complaints.  Was seen recently at urgent care for upset stomach, nausea/vomiting.  Had the headache present at that time but it was mild, presents today with worsening symptoms.  [place] Nurse, learning disability used.  Prior to Admission medications   Medication Sig Start Date End Date Taking? Authorizing Provider  albuterol  (VENTOLIN  HFA) 108 (90 Base) MCG/ACT inhaler Inhale 2 puffs into the lungs every 6 (six) hours as needed for wheezing or shortness of breath. 05/21/22   Joshua Debby CROME, MD  cholecalciferol (VITAMIN D3) 25 MCG (1000 UNIT) tablet Take 1 tablet (1,000 Units total) by mouth daily. 01/11/20   Just, Kelsea J, FNP  ondansetron  (ZOFRAN -ODT) 4 MG disintegrating tablet Take 1 tablet (4 mg total) by mouth every 8 (eight) hours as needed for nausea or vomiting. 12/18/23   Reddick, Johnathan B, NP  Suvorexant  (BELSOMRA ) 10 MG TABS Take 10 mg by mouth at bedtime as needed. Patient not taking: Reported on 08/09/2023 12/10/20   Joshua Debby CROME, MD    Allergies: Patient has no known allergies.    Review of Systems  Constitutional:  Positive for fatigue. Negative for fever.   Eyes:  Negative for visual disturbance.  Respiratory:  Negative for shortness of breath.   Cardiovascular:  Negative for chest pain.  Gastrointestinal:  Positive for nausea and vomiting. Negative for abdominal pain and diarrhea.  Musculoskeletal:  Negative for neck pain and neck stiffness.  Skin:  Negative for rash.  Neurological:  Positive for headaches. Negative for facial asymmetry, speech difficulty, weakness and numbness.    Updated Vital Signs BP (!) 157/84   Pulse 66   Temp 98.8 F (37.1 C)   Resp 16   SpO2 100%   Physical Exam Vitals and nursing note reviewed.  Constitutional:      General: She is not in acute distress.    Appearance: Normal appearance.  HENT:     Head: Normocephalic.     Mouth/Throat:     Mouth: Mucous membranes are moist.  Eyes:     Extraocular Movements: Extraocular movements intact.     Pupils: Pupils are equal, round, and reactive to light.  Cardiovascular:     Rate and Rhythm: Normal rate.  Pulmonary:     Effort: Pulmonary effort is normal. No respiratory distress.  Abdominal:     Palpations: Abdomen is soft.     Tenderness: There is no abdominal tenderness.  Musculoskeletal:     Cervical back: No rigidity or tenderness.  Skin:    General: Skin is warm.  Neurological:     General: No focal deficit present.     Mental Status: She is alert and oriented to person, place, and time. Mental status is at  baseline.  Psychiatric:        Mood and Affect: Mood normal.     (all labs ordered are listed, but only abnormal results are displayed) Labs Reviewed  COMPREHENSIVE METABOLIC PANEL WITH GFR - Abnormal; Notable for the following components:      Result Value   Glucose, Bld 113 (*)    Calcium 8.6 (*)    Total Protein 6.4 (*)    All other components within normal limits  CBC - Abnormal; Notable for the following components:   Hemoglobin 11.5 (*)    All other components within normal limits  URINALYSIS, ROUTINE W REFLEX MICROSCOPIC -  Abnormal; Notable for the following components:   Color, Urine STRAW (*)    Ketones, ur 20 (*)    Leukocytes,Ua TRACE (*)    All other components within normal limits  RESP PANEL BY RT-PCR (RSV, FLU A&B, COVID)  RVPGX2  LIPASE, BLOOD    EKG: None  Radiology: No results found.  {Document cardiac monitor, telemetry assessment procedure when appropriate:32947} Procedures   Medications Ordered in the ED  sodium chloride  0.9 % bolus 500 mL (has no administration in time range)  ketorolac  (TORADOL ) 15 MG/ML injection 15 mg (has no administration in time range)  metoCLOPramide (REGLAN) injection 5 mg (has no administration in time range)  diphenhydrAMINE (BENADRYL) injection 12.5 mg (has no administration in time range)      {Click here for ABCD2, HEART and other calculators REFRESH Note before signing:1}                              Medical Decision Making Amount and/or Complexity of Data Reviewed Labs: ordered. Radiology: ordered.  Risk Prescription drug management.   ***  {Document critical care time when appropriate  Document review of labs and clinical decision tools ie CHADS2VASC2, etc  Document your independent review of radiology images and any outside records  Document your discussion with family members, caretakers and with consultants  Document social determinants of health affecting pt's care  Document your decision making why or why not admission, treatments were needed:32947:::1}   Final diagnoses:  None    ED Discharge Orders     None

## 2023-12-21 NOTE — ED Triage Notes (Signed)
 Pt reports continuing nausea and vomiting. Seen at UC 2 days ago. Taking prescribed zofran . Reports headaches worse at night. Able to keep some food down but not a lot.

## 2023-12-22 NOTE — Discharge Instructions (Signed)
 B?n ? ???c khm v xu?t vi?n t? khoa C?p c?u. K?t qu? xt nghi?m mu c?a b?n bnh th??ng. Hnh ?nh ch?p CT m?ch mu v ??u c?a b?n bnh th??ng. B?n ? ???c ?i?u tr? b?ng thu?c tim t?nh m?ch.  Hy lin h? v?i bc s? th?n kinh ?? ?nh gi thm v? tnh tr?ng ?au ??u th??ng xuyn. Hy theo di v?i bc s? chnh c?a b?n ?? ???c ?nh gi v ch?m [place] thm. Mang thu?c v? nh theo ??n. N?u b?n c b?t k? tri?u ch?ng no tr? n?ng h?n ho?c c thm lo ng?i v? s?c kh?e, vui lng quay l?i khoa C?p c?u ?? ???c ?nh gi thm.

## 2023-12-30 ENCOUNTER — Ambulatory Visit (INDEPENDENT_AMBULATORY_CARE_PROVIDER_SITE_OTHER): Payer: Medicare (Managed Care) | Admitting: Internal Medicine

## 2023-12-30 ENCOUNTER — Encounter: Payer: Self-pay | Admitting: Internal Medicine

## 2023-12-30 VITALS — BP 124/76 | HR 67 | Temp 98.6°F | Resp 16 | Ht <= 58 in | Wt 98.8 lb

## 2023-12-30 DIAGNOSIS — M316 Other giant cell arteritis: Secondary | ICD-10-CM

## 2023-12-30 DIAGNOSIS — D539 Nutritional anemia, unspecified: Secondary | ICD-10-CM

## 2023-12-30 DIAGNOSIS — G4451 Hemicrania continua: Secondary | ICD-10-CM

## 2023-12-30 DIAGNOSIS — E785 Hyperlipidemia, unspecified: Secondary | ICD-10-CM | POA: Diagnosis not present

## 2023-12-30 DIAGNOSIS — Z136 Encounter for screening for cardiovascular disorders: Secondary | ICD-10-CM

## 2023-12-30 LAB — CBC WITH DIFFERENTIAL/PLATELET
Basophils Absolute: 0 K/uL (ref 0.0–0.1)
Basophils Relative: 0.4 % (ref 0.0–3.0)
Eosinophils Absolute: 0.2 K/uL (ref 0.0–0.7)
Eosinophils Relative: 3.2 % (ref 0.0–5.0)
HCT: 36.9 % (ref 36.0–46.0)
Hemoglobin: 12.4 g/dL (ref 12.0–15.0)
Lymphocytes Relative: 31.8 % (ref 12.0–46.0)
Lymphs Abs: 1.7 K/uL (ref 0.7–4.0)
MCHC: 33.7 g/dL (ref 30.0–36.0)
MCV: 90.9 fl (ref 78.0–100.0)
Monocytes Absolute: 0.5 K/uL (ref 0.1–1.0)
Monocytes Relative: 9.4 % (ref 3.0–12.0)
Neutro Abs: 3 K/uL (ref 1.4–7.7)
Neutrophils Relative %: 55.2 % (ref 43.0–77.0)
Platelets: 426 K/uL — ABNORMAL HIGH (ref 150.0–400.0)
RBC: 4.06 Mil/uL (ref 3.87–5.11)
RDW: 13.3 % (ref 11.5–15.5)
WBC: 5.4 K/uL (ref 4.0–10.5)

## 2023-12-30 LAB — SEDIMENTATION RATE: Sed Rate: 55 mm/h — ABNORMAL HIGH (ref 0–30)

## 2023-12-30 LAB — IBC + FERRITIN
Ferritin: 106.4 ng/mL (ref 10.0–291.0)
Iron: 92 ug/dL (ref 42–145)
Saturation Ratios: 34.6 % (ref 20.0–50.0)
TIBC: 266 ug/dL (ref 250.0–450.0)
Transferrin: 190 mg/dL — ABNORMAL LOW (ref 212.0–360.0)

## 2023-12-30 LAB — C-REACTIVE PROTEIN: CRP: 0.6 mg/dL (ref 0.5–20.0)

## 2023-12-30 LAB — FOLATE: Folate: 12.3 ng/mL (ref >5.9)

## 2023-12-30 LAB — RETICULOCYTES
ABS Retic: 55020 {cells}/uL (ref 20000–80000)
Retic Ct Pct: 1.4 %

## 2023-12-30 LAB — VITAMIN B12: Vitamin B-12: 1116 pg/mL — ABNORMAL HIGH (ref 211–911)

## 2023-12-30 MED ORDER — ROSUVASTATIN CALCIUM 10 MG PO TABS: 10.0000 mg | ORAL_TABLET | Freq: Every day | ORAL | 0 refills | Status: AC

## 2023-12-30 MED ORDER — METHYLPREDNISOLONE ACETATE 40 MG/ML IJ SUSP
120.0000 mg | Freq: Once | INTRAMUSCULAR | Status: AC
  Administered 2023-12-30: 120 mg via INTRAMUSCULAR

## 2023-12-30 MED ORDER — PREDNISONE 20 MG PO TABS: 60.0000 mg | ORAL_TABLET | Freq: Every day | ORAL | 0 refills | Status: DC

## 2023-12-30 NOTE — Patient Instructions (Signed)
 Temporal Arteritis  Temporal arteritis is a condition that makes your arteries become inflamed. It can happen in any part of the body. Most often, it affects your head and face. It is also called giant cell arteritis. This condition can cause severe problems such as blindness. Early treatment can help prevent those problems. What are the causes? The cause of this condition is not known. What increases the risk? You may be more likely to get this condition if: You are older than 75 years of age. You are female. You are Caucasian. You are of [place], [place], [place], [place], or [place] ancestry. You have a family history of the condition. You have polymyalgia rheumatica (PMR). This is a condition that causes muscle pain and stiffness. What are the signs or symptoms? Some people with this condition have just one symptom. Others have more than one symptom. Most symptoms are related to the head and face. These may include: Headache. Hard, swollen, or tender temples. Your temples are the areas on either side of your forehead. Pain when you comb your hair or when you lay your head down. Pain in your jaw when you chew. Pain in your throat or tongue. Problems with your vision. These may include sudden loss of vision in one eye or seeing double. Other symptoms may include: Fever. Feeling tired (fatigue). A dry cough. Pain in your hips and shoulders. Pain in your arms when you exercise. Depression. Weight loss. How is this diagnosed? This condition may be diagnosed based on your symptoms, your medical history, and a physical exam. You may also have tests done. These may include: Blood tests. Biopsy. This is when a small piece of tissue is removed for testing. Imaging tests, such as an ultrasound or MRI. How is this treated? This condition may be treated with medicines to: Reduce inflammation (steroids). Weaken your immune system (immunosuppressants). Your immune system is your body's  defense system. Treat vision problems. Follow these instructions at home: Medicines Take over-the-counter and prescription medicines only as told by your health care provider. Take any vitamins or supplements that your provider recommends. These may include vitamin D and calcium to help stop your bones from getting weak. Eating and drinking  Eat foods that are good for your heart. These may include: Foods high in fiber, such as fresh fruits and vegetables, whole grains, and beans. Foods that have healthy fats in them (omega-3 fats), such as fish, flaxseed, and flaxseed oil. Limit foods that are high in saturated fat and cholesterol. These include processed and fried foods, fatty meat, and full-fat dairy. Limit how much salt (sodium) you eat. Include calcium and vitamin D in your diet. Good sources of these include: Low-fat dairy products, such as milk, yogurt, and cheese. Certain fish, such as fresh or canned salmon, tuna, and sardines. Fortified products. This means that they have calcium and vitamin D added to them. These include fortified cereals and juice. General instructions Exercise. Talk with your provider about what exercises are safe for you. You may be told to do exercises that make your heart beat faster (aerobic exercise). These can help control your blood pressure and prevent bone loss. Stay up-to-date on all of your vaccines as told by your provider. Keep all follow-up visits. The medicines used to treat this condition can make you more likely to have problems such as bone loss or diabetes. Your provider will check for problems during your follow-up visits. Contact a health care provider if: Your symptoms get worse. You have any  signs of infection. These may include fever, swelling, redness, warmth, or tenderness. Get help right away if: You lose your vision. Your pain does not go away, even after you take medicine. You have chest pain. You have trouble breathing. One side  of your face or body gets weak or numb all of a sudden. These symptoms may be an emergency. Get help right away. Call 911. Do not wait to see if the symptoms will go away. Do not drive yourself to the hospital. This information is not intended to replace advice given to you by your health care provider. Make sure you discuss any questions you have with your health care provider. Document Revised: 12/22/2021 Document Reviewed: 12/22/2021 Elsevier Patient Education  2024 [employer].

## 2023-12-30 NOTE — Progress Notes (Signed)
 Subjective:  Patient ID: Deborah Glass, female    DOB: Aug 15, 1950  Age: 73 y.o. MRN: [id number]  CC: Hospitalization Follow-up (Patient states that she's been feeling about 50% better since she left the hospital not completely. ) and Headache   HPI [name] presents for f/up ----  Discussed the use of AI scribe software for clinical note transcription with the patient, who gave verbal consent to proceed.  History of Present Illness Deborah Glass is a 73 year old female who presents with a three-week history of headache.  She has been experiencing a headache for approximately three weeks, localized to the left temple, accompanied by blurred vision in her left eye by about forty percent. No eye pain is present. She also reports slight slurring of speech and feeling off balance. There is no numbness, weakness, or tingling in the body. She reports occasional pain in the left shoulder.  She visited urgent care last Friday, where she was evaluated and given medication for vomiting. She is no longer vomiting and does not feel nauseous today.  She is not currently taking any medications for the headache and has not been prescribed any new medications recently.     Outpatient Medications Prior to Visit  Medication Sig Dispense Refill   albuterol  (VENTOLIN  HFA) 108 (90 Base) MCG/ACT inhaler Inhale 2 puffs into the lungs every 6 (six) hours as needed for wheezing or shortness of breath. 18 g 3   cholecalciferol (VITAMIN D3) 25 MCG (1000 UNIT) tablet Take 1 tablet (1,000 Units total) by mouth daily. 90 tablet 1   ondansetron  (ZOFRAN -ODT) 4 MG disintegrating tablet Take 1 tablet (4 mg total) by mouth every 8 (eight) hours as needed for nausea or vomiting. 20 tablet 0   Suvorexant  (BELSOMRA ) 10 MG TABS Take 10 mg by mouth at bedtime as needed. (Patient not taking: Reported on 08/09/2023) 90 tablet 0   No facility-administered medications prior to visit.    ROS Review of Systems   Constitutional:  Negative for appetite change, chills, diaphoresis, fatigue and fever.  HENT: Negative.  Negative for trouble swallowing.   Eyes:  Positive for visual disturbance. Negative for photophobia.  Respiratory:  Negative for apnea, choking, shortness of breath and wheezing.   Cardiovascular:  Negative for chest pain, palpitations and leg swelling.  Gastrointestinal:  Positive for diarrhea and nausea. Negative for abdominal pain, blood in stool, constipation and vomiting.  Genitourinary: Negative.  Negative for difficulty urinating.  Musculoskeletal: Negative.  Negative for arthralgias and myalgias.  Skin: Negative.   Neurological:  Positive for headaches.    Objective:  BP 124/76 (BP Location: Left Arm, Patient Position: Sitting, Cuff Size: Normal)   Pulse 67   Temp 98.6 F (37 C) (Oral)   Resp 16   Ht 4' 9 (1.448 m)   Wt 98 lb 12.8 oz (44.8 kg)   SpO2 95%   BMI 21.38 kg/m   BP Readings from Last 3 Encounters:  12/30/23 124/76  12/21/23 (!) 144/77  12/18/23 117/75    Wt Readings from Last 3 Encounters:  12/30/23 98 lb 12.8 oz (44.8 kg)  08/09/23 102 lb 6.4 oz (46.4 kg)  05/21/22 106 lb (48.1 kg)    Physical Exam Vitals reviewed.  Constitutional:      Appearance: She is well-developed.  HENT:     Head: Normocephalic.     Comments: No TTP     Mouth/Throat:     Mouth: Mucous membranes are moist.  Eyes:  General: No scleral icterus.    Extraocular Movements: Extraocular movements intact.     Pupils: Pupils are equal, round, and reactive to light.  Cardiovascular:     Rate and Rhythm: Normal rate and regular rhythm.     Pulses: Normal pulses.     Heart sounds: No murmur heard.    No friction rub. No gallop.     Comments: EKG --- NSR, 63 bpm RBBB Unchanged  Pulmonary:     Effort: Pulmonary effort is normal.     Breath sounds: No stridor. No wheezing, rhonchi or rales.  Abdominal:     Palpations: There is no mass.     Tenderness: There is no  abdominal tenderness. There is no guarding.     Hernia: No hernia is present.  Musculoskeletal:     Cervical back: Neck supple.     Right lower leg: No edema.     Left lower leg: No edema.  Lymphadenopathy:     Cervical: No cervical adenopathy.  Skin:    General: Skin is warm and dry.  Neurological:     General: No focal deficit present.     Mental Status: She is alert. Mental status is at baseline.     Cranial Nerves: Cranial nerves 2-12 are intact.     Sensory: Sensation is intact.     Motor: Motor function is intact.     Coordination: Coordination is intact.     Gait: Gait is intact.     Deep Tendon Reflexes: Reflexes normal.     Reflex Scores:      Tricep reflexes are 1+ on the right side and 1+ on the left side.      Bicep reflexes are 1+ on the right side and 1+ on the left side.      Brachioradialis reflexes are 1+ on the right side and 1+ on the left side.      Patellar reflexes are 2+ on the right side and 2+ on the left side.      Achilles reflexes are 1+ on the right side and 1+ on the left side. Psychiatric:        Mood and Affect: Mood normal.        Behavior: Behavior normal.     Lab Results  Component Value Date   WBC 5.4 12/30/2023   HGB 12.4 12/30/2023   HCT 36.9 12/30/2023   PLT 426.0 (H) 12/30/2023   GLUCOSE 113 (H) 12/21/2023   CHOL 250 (H) 08/09/2023   TRIG 90.0 08/09/2023   HDL 81.50 08/09/2023   LDLCALC 151 (H) 08/09/2023   ALT 7 12/21/2023   AST 25 12/21/2023   NA 136 12/21/2023   K 3.9 12/21/2023   CL 103 12/21/2023   CREATININE 0.52 12/21/2023   BUN 8 12/21/2023   CO2 24 12/21/2023   TSH 3.26 03/31/2022   INR 1.6 (H) 01/21/2021   HGBA1C 5.6 03/31/2022    CT ANGIO HEAD NECK W WO CM Result Date: 12/21/2023 CLINICAL DATA:  Initial evaluation for acute syncope. EXAM: CT ANGIOGRAPHY HEAD AND NECK WITH AND WITHOUT CONTRAST TECHNIQUE: Multidetector CT imaging of the head and neck was performed using the standard protocol during bolus  administration of intravenous contrast. Multiplanar CT image reconstructions and MIPs were obtained to evaluate the vascular anatomy. Carotid stenosis measurements (when applicable) are obtained utilizing NASCET criteria, using the distal internal carotid diameter as the denominator. RADIATION DOSE REDUCTION: This exam was performed according to the departmental dose-optimization program which includes  automated exposure control, adjustment of the mA and/or kV according to patient size and/or use of iterative reconstruction technique. CONTRAST:  75mL OMNIPAQUE  IOHEXOL  350 MG/ML SOLN COMPARISON:  None Available. FINDINGS: CT HEAD FINDINGS Brain: Cerebral volume within normal limits. Remote lacunar infarct present at the anterior left basal ganglia. No acute intracranial hemorrhage. No acute large vessel territory infarct. No mass lesion or midline shift. No hydrocephalus or extra-axial fluid collection. Vascular: No abnormal hyperdense vessel. Calcified atherosclerosis present at skull base. Skull: Scalp soft tissues within normal limits.  Calvarium intact. Sinuses/Orbits: Globes and orbital soft tissues within normal limits. Paranasal sinuses and mastoid air cells are largely clear. Other: Few scattered foci of soft tissue gas present within the right infratemporal fossa, likely related IV access. Review of the MIP images confirms the above findings CTA NECK FINDINGS Aortic arch: Standard branching. Imaged portion shows no evidence of aneurysm or dissection. No significant stenosis of the major arch vessel origins. Mild aortic atherosclerosis. Right carotid system: Right common and internal carotid arteries are patent without dissection. Mild atheromatous change about the right carotid bulb without stenosis. Left carotid system: Left common and internal carotid arteries patent without dissection. Mild atheromatous change about the left carotid bulb without hemodynamically significant stenosis. Vertebral arteries:  Left vertebral artery dominant. Vertebral arteries are patent without stenosis or dissection. Skeleton: No worrisome osseous lesions. Mild to moderate cervical spondylosis. Patient is edentulous. Other neck: No other acute finding. Upper chest: No other acute finding. Review of the MIP images confirms the above findings CTA HEAD FINDINGS Anterior circulation: Atheromatous change about the carotid siphons with no more than mild stenoses bilaterally. A1 segments patent bilaterally. Normal anterior communicating complex. ACAs patent without stenosis. Focal moderate proximal right M1 stenosis (series 17, image 23). Left M1 segment widely patent. No proximal MCA branch occlusion or high-grade stenosis for distal MCA branches perfused and symmetric. Posterior circulation: Left vertebral artery slightly dominant. Extensive atheromatous change about the V4 segments bilaterally with associated severe bilateral V4 stenoses (series 14, images 174, 179 on the right, image 172 on the left). Left PICA patent. Right PICA origin not well seen. Basilar patent without stenosis. Superior cerebellar and posterior cerebral arteries patent bilaterally. Venous sinuses: Grossly patent allowing for timing the contrast bolus. Anatomic variants: As above.  No aneurysm. Review of the MIP images confirms the above findings IMPRESSION: CT HEAD: 1. No acute intracranial abnormality. 2. Remote lacunar infarct at the anterior left basal ganglia. CTA HEAD AND NECK: 1. Negative CTA for large vessel occlusion or other emergent finding. 2. Extensive atheromatous change about the intracranial vertebral arteries with associated severe bilateral V4 stenoses. 3. Focal moderate proximal right M1 stenosis. 4. Atheromatous change about the carotid bifurcations and carotid siphons without hemodynamically significant stenosis. Aortic Atherosclerosis (ICD10-I70.0). Electronically Signed   By: Morene Hoard M.D.   On: 12/21/2023 21:42    Assessment &  Plan:   Hemicrania continua -     Sedimentation rate; Future -     C-reactive protein; Future  Deficiency anemia- H/H are normal now. -     CBC with Differential/Platelet; Future -     Folate; Future -     Vitamin B12; Future -     IBC + Ferritin; Future -     Reticulocytes; Future  Temporal arteritis (HCC)- Will start corticosteroids. -     Sedimentation rate; Future -     C-reactive protein; Future -     methylPREDNISolone Acetate -     Ambulatory  referral to Glass -     predniSONE ; Take 3 tablets (60 mg total) by mouth daily with breakfast.  Dispense: 36 tablet; Refill: 0 -     AMB Referral [facility] Care Management  Screening for ischemic heart disease- EKG is unchanged. -     EKG 12-Lead  Hyperlipidemia with target LDL less than 130- Will start a statin for CV risk reduction. -     Rosuvastatin Calcium; Take 1 tablet (10 mg total) by mouth daily.  Dispense: 90 tablet; Refill: 0     Follow-up: Return in about 3 months (around 03/31/2024).  Debby Molt, MD

## 2023-12-31 ENCOUNTER — Ambulatory Visit: Payer: Self-pay | Admitting: Internal Medicine

## 2024-01-03 ENCOUNTER — Telehealth: Payer: Self-pay | Admitting: *Deleted

## 2024-01-03 NOTE — Progress Notes (Unsigned)
 Care Guide Pharmacy Note  01/03/2024 Name: Britteny Fiebelkorn MRN: [id number] DOB: 02-23-1951  Referred By: Joshua Debby CROME, MD Reason for referral: Call Attempt #1 and Complex Care Management (Outreach to schedule referral with pharmacist )   Quintasha Thi Tankard is a 73 y.o. year old female who is a primary care patient of Joshua Debby CROME, MD.  Yarissa Jonni Oelkers was referred to the pharmacist for assistance related to: Temporal arteritis   An unsuccessful telephone outreach was attempted today to contact the patient who was referred to the pharmacy team for assistance with medication management. Additional attempts will be made to contact the patient.  Thedford Franks, CMA [place]  [facility], [facility] Guide Direct Dial: [phone]  Fax: [phone] Website: [email or web address]

## 2024-01-04 NOTE — Progress Notes (Unsigned)
 Care Guide Pharmacy Note  01/04/2024 Name: Deborah Glass MRN: [id number] DOB: May 25, 1950  Referred By: Joshua Debby CROME, MD Reason for referral: Call Attempt #1 and Complex Care Management (Outreach to schedule referral with pharmacist )   Deborah Glass is a 73 y.o. year old female who is a primary care patient of Joshua Debby CROME, MD.  Deborah Glass was referred to the pharmacist for assistance related to:  Temporal arteritis   A second unsuccessful telephone outreach was attempted today to contact the patient who was referred to the pharmacy team for assistance with medication management. Additional attempts will be made to contact the patient.  Thedford Franks, CMA [place]  [facility], [facility] Guide Direct Dial: [phone]  Fax: [phone] Website: [email or web address]

## 2024-01-05 NOTE — Progress Notes (Signed)
 Care Guide Pharmacy Note  01/05/2024 Name: Korbin Notaro MRN: [id number] DOB: 16-Jul-1950  Referred By: Joshua Debby CROME, MD Reason for referral: Call Attempt #1 and Complex Care Management (Outreach to schedule referral with pharmacist )   Syvilla Thi Dinunzio is a 73 y.o. year old female who is a primary care patient of Joshua Debby CROME, MD.  Brookelynne Nylia Gavina was referred to the pharmacist for assistance related to: Temporal arteritis   A third unsuccessful telephone outreach was attempted today to contact the patient who was referred to the pharmacy team for assistance with medication management. The Population Health team is pleased to engage with this patient at any time in the future upon receipt of referral and should he/she be interested in assistance from the Population Health team.  Thedford Franks, CMA [facility]  [facility], [facility] Guide Direct Dial: [phone]  Fax: [phone] Website: [email or web address]

## 2024-01-19 ENCOUNTER — Other Ambulatory Visit: Payer: Self-pay | Admitting: Internal Medicine

## 2024-01-19 DIAGNOSIS — M316 Other giant cell arteritis: Secondary | ICD-10-CM

## 2024-01-19 MED ORDER — PREDNISONE 20 MG PO TABS: 40.0000 mg | ORAL_TABLET | Freq: Every day | ORAL | 0 refills | Status: AC

## 2024-03-14 ENCOUNTER — Ambulatory Visit: Payer: Medicare (Managed Care)

## 2024-04-10 ENCOUNTER — Telehealth: Payer: Self-pay | Admitting: Internal Medicine

## 2024-04-10 NOTE — Telephone Encounter (Signed)
-----   Message from Debby Molt, MD sent at 01/02/2024 11:35 AM EDT ----- Regarding: Prednisone  dose recheck
